# Patient Record
Sex: Male | Born: 1935
Health system: Southern US, Community
[De-identification: ages and names within clinical notes are randomized; demographics above are authoritative.]

## PROBLEM LIST (undated history)

## (undated) DIAGNOSIS — I714 Abdominal aortic aneurysm, without rupture, unspecified: Secondary | ICD-10-CM

## (undated) DIAGNOSIS — I779 Disorder of arteries and arterioles, unspecified: Secondary | ICD-10-CM

## (undated) DIAGNOSIS — E785 Hyperlipidemia, unspecified: Secondary | ICD-10-CM

## (undated) DIAGNOSIS — E039 Hypothyroidism, unspecified: Secondary | ICD-10-CM

## (undated) DIAGNOSIS — J449 Chronic obstructive pulmonary disease, unspecified: Secondary | ICD-10-CM

## (undated) DIAGNOSIS — I739 Peripheral vascular disease, unspecified: Secondary | ICD-10-CM

## (undated) DIAGNOSIS — M359 Systemic involvement of connective tissue, unspecified: Secondary | ICD-10-CM

## (undated) DIAGNOSIS — R0600 Dyspnea, unspecified: Secondary | ICD-10-CM

## (undated) DIAGNOSIS — Z9981 Dependence on supplemental oxygen: Secondary | ICD-10-CM

## (undated) DIAGNOSIS — J849 Interstitial pulmonary disease, unspecified: Secondary | ICD-10-CM

## (undated) DIAGNOSIS — IMO0002 Reserved for concepts with insufficient information to code with codable children: Secondary | ICD-10-CM

## (undated) DIAGNOSIS — R9430 Abnormal result of cardiovascular function study, unspecified: Secondary | ICD-10-CM

## (undated) DIAGNOSIS — I350 Nonrheumatic aortic (valve) stenosis: Secondary | ICD-10-CM

## (undated) HISTORY — PX: HERNIA REPAIR: SHX51

## (undated) HISTORY — DX: Peripheral vascular disease, unspecified: I73.9

## (undated) HISTORY — DX: Hyperlipidemia, unspecified: E78.5

## (undated) HISTORY — DX: Nonrheumatic aortic (valve) stenosis: I35.0

## (undated) HISTORY — DX: Reserved for concepts with insufficient information to code with codable children: IMO0002

## (undated) HISTORY — DX: Abdominal aortic aneurysm, without rupture, unspecified: I71.40

## (undated) HISTORY — DX: Hypothyroidism, unspecified: E03.9

## (undated) HISTORY — DX: Disorder of arteries and arterioles, unspecified: I77.9

## (undated) HISTORY — DX: Interstitial pulmonary disease, unspecified: J84.9

## (undated) HISTORY — DX: Abdominal aortic aneurysm, without rupture: I71.4

## (undated) HISTORY — DX: Systemic involvement of connective tissue, unspecified: M35.9

## (undated) HISTORY — DX: Abnormal result of cardiovascular function study, unspecified: R94.30

## (undated) HISTORY — DX: Chronic obstructive pulmonary disease, unspecified: J44.9

---

## 1997-04-28 HISTORY — PX: COLONOSCOPY: SHX174

## 1999-09-23 ENCOUNTER — Inpatient Hospital Stay (HOSPITAL_COMMUNITY): Admission: EM | Admit: 1999-09-23 | Discharge: 1999-09-25 | Payer: Self-pay | Admitting: Internal Medicine

## 1999-09-25 ENCOUNTER — Encounter: Payer: Self-pay | Admitting: Internal Medicine

## 2001-04-28 HISTORY — PX: ESOPHAGOGASTRODUODENOSCOPY: SHX1529

## 2001-07-21 ENCOUNTER — Encounter: Payer: Self-pay | Admitting: Gastroenterology

## 2001-07-21 ENCOUNTER — Ambulatory Visit (HOSPITAL_COMMUNITY): Admission: RE | Admit: 2001-07-21 | Discharge: 2001-07-21 | Payer: Self-pay | Admitting: Gastroenterology

## 2005-01-22 ENCOUNTER — Ambulatory Visit: Payer: Self-pay | Admitting: Cardiology

## 2005-03-03 ENCOUNTER — Ambulatory Visit: Payer: Self-pay | Admitting: Cardiology

## 2005-07-14 ENCOUNTER — Ambulatory Visit: Payer: Self-pay | Admitting: Cardiology

## 2005-11-06 ENCOUNTER — Ambulatory Visit: Payer: Self-pay | Admitting: Internal Medicine

## 2005-12-18 ENCOUNTER — Ambulatory Visit: Payer: Self-pay | Admitting: Internal Medicine

## 2006-07-30 ENCOUNTER — Ambulatory Visit: Payer: Self-pay | Admitting: Cardiology

## 2006-08-08 ENCOUNTER — Ambulatory Visit: Payer: Self-pay | Admitting: Cardiology

## 2006-09-14 ENCOUNTER — Ambulatory Visit: Payer: Self-pay | Admitting: Cardiology

## 2006-12-14 ENCOUNTER — Ambulatory Visit: Payer: Self-pay | Admitting: Cardiology

## 2007-07-27 ENCOUNTER — Ambulatory Visit: Payer: Self-pay | Admitting: Cardiology

## 2008-04-28 HISTORY — PX: COLONOSCOPY: SHX174

## 2008-11-06 ENCOUNTER — Ambulatory Visit: Payer: Self-pay | Admitting: Cardiology

## 2009-06-12 ENCOUNTER — Ambulatory Visit (HOSPITAL_COMMUNITY): Admission: RE | Admit: 2009-06-12 | Discharge: 2009-06-13 | Payer: Self-pay | Admitting: Ophthalmology

## 2009-06-13 ENCOUNTER — Encounter (INDEPENDENT_AMBULATORY_CARE_PROVIDER_SITE_OTHER): Payer: Self-pay | Admitting: Ophthalmology

## 2010-06-26 ENCOUNTER — Encounter: Payer: Self-pay | Admitting: Physician Assistant

## 2010-06-26 ENCOUNTER — Encounter: Payer: Self-pay | Admitting: Cardiology

## 2010-06-27 ENCOUNTER — Encounter: Payer: Self-pay | Admitting: Cardiology

## 2010-06-27 DIAGNOSIS — R079 Chest pain, unspecified: Secondary | ICD-10-CM

## 2010-06-27 DIAGNOSIS — R072 Precordial pain: Secondary | ICD-10-CM

## 2010-07-02 ENCOUNTER — Encounter: Payer: Self-pay | Admitting: Cardiology

## 2010-07-02 ENCOUNTER — Ambulatory Visit (INDEPENDENT_AMBULATORY_CARE_PROVIDER_SITE_OTHER): Payer: Medicare Other | Admitting: Cardiology

## 2010-07-02 DIAGNOSIS — I080 Rheumatic disorders of both mitral and aortic valves: Secondary | ICD-10-CM | POA: Insufficient documentation

## 2010-07-02 DIAGNOSIS — I2510 Atherosclerotic heart disease of native coronary artery without angina pectoris: Secondary | ICD-10-CM

## 2010-07-02 DIAGNOSIS — I350 Nonrheumatic aortic (valve) stenosis: Secondary | ICD-10-CM | POA: Insufficient documentation

## 2010-07-02 DIAGNOSIS — E039 Hypothyroidism, unspecified: Secondary | ICD-10-CM | POA: Insufficient documentation

## 2010-07-02 DIAGNOSIS — E785 Hyperlipidemia, unspecified: Secondary | ICD-10-CM | POA: Insufficient documentation

## 2010-07-02 DIAGNOSIS — I6529 Occlusion and stenosis of unspecified carotid artery: Secondary | ICD-10-CM | POA: Insufficient documentation

## 2010-07-02 DIAGNOSIS — I495 Sick sinus syndrome: Secondary | ICD-10-CM | POA: Insufficient documentation

## 2010-07-02 HISTORY — DX: Nonrheumatic aortic (valve) stenosis: I35.0

## 2010-07-04 NOTE — Consult Note (Signed)
Summary: CARDIOLOGY CONSULT/ [facility]  CARDIOLOGY CONSULT/ [facility]   Imported By: Zachary George 06/28/2010 09:35:43  _____________________________________________________________________  External Attachment:    Type:   Image     Comment:   External Document

## 2010-07-09 NOTE — Miscellaneous (Signed)
  Clinical Lists Changes  Observations: Added new observation of PAST MED HX:  dyslipidemia COPD... pulmonary fibrosis...,bronchiectasis.... followed [facility]  Dr.Morrison pulmonary nodule.... stable. by history BPH Esophageal stricture  hiatal hernia parotiditis Squamous cell carcinoma    2000 Hypothyroidism Herpes zoster Peripheral neuropathy Paresthesias   left arm... no etiology in the past Mitral regurgitation   mild.... echo... August, 2009 /  mild... echo... March, 2012 renal insufficiency...mild CAD                 PCI LAD 1993  /  catheterization 2001, 80% diagonal, 80% circumflex, long 99% subtotal RCA with distal collateralization from the circumflex... medical therapy recommended /  nuclear July, 2010.. no ischemia.. EF 65% EF      normal by history /  EF 65% nuclear  July, 2010  / EF 60%... echo... March, 2012 Aortic valve sclerosis   echo... March, 2012 Sinus bradycardia   chronic AAA   3.1 cm.  March, 2010 carotid artery disease   Doppler.  2009... less than 50% bilaterally.... plaque he is present (07/02/2010 8:45) Added new observation of PRIMARY MD: Sherryll Burger (07/02/2010 8:45)       Past History:  Past Medical History:  dyslipidemia COPD... pulmonary fibrosis...,bronchiectasis.... followed [facility]  Dr.Morrison pulmonary nodule.... stable. by history BPH Esophageal stricture  hiatal hernia parotiditis Squamous cell carcinoma    2000 Hypothyroidism Herpes zoster Peripheral neuropathy Paresthesias   left arm... no etiology in the past Mitral regurgitation   mild.... echo... August, 2009 /  mild... echo... March, 2012 renal insufficiency...mild CAD                 PCI LAD 1993  /  catheterization 2001, 80% diagonal, 80% circumflex, long 99% subtotal RCA with distal collateralization from the circumflex... medical therapy recommended /  nuclear July, 2010.. no ischemia.. EF 65% EF      normal by history /  EF 65% nuclear  July, 2010  / EF 60%... echo... March,  2012 Aortic valve sclerosis   echo... March, 2012 Sinus bradycardia   chronic AAA   3.1 cm.  March, 2010 carotid artery disease   Doppler.  2009... less than 50% bilaterally.... plaque he is present

## 2010-07-09 NOTE — Consult Note (Signed)
Summary: CARDIOLOGY CONSULT/ [facility]  CARD[facility]OLOGY CON[facility]ULT/ [facility]   Imported By: Zachary George 07/02/2010 10:16:23  _____________________________________________________________________  External Attachment:    Type:   Image     Comment:   External Document

## 2010-07-09 NOTE — Medication Information (Signed)
Summary: [facility] D/C MEDICATION SHEET ORDER  [facility] D/[facility] MEDICATION SHEET ORDER   Imported By: Zachary George 07/02/2010 10:19:20  _____________________________________________________________________  External Attachment:    Type:   Image     Comment:   External Document

## 2010-07-09 NOTE — Assessment & Plan Note (Signed)
Summary: [facility]-POST [facility] PER [facility] WITH [facility] FIRST AVIALABLE -SRS   Visit Type:  post hospital visit Primary Provider:  Kirstie Peri, MD  CC:  CAD.  History of Present Illness: The patient is seen for followup of coronary artery disease..  I had seen him in the office last in 2009.  I have reviewed the old records and updated the current electronic medical record.  The patient was hospitalized in February, 2012.  I have reviewed the hospital records and the echocardiogram.  The patient had chest pain.  He is active and works out at [employer].  Looking back I believe that he had musculoskeletal pain and brought him into the hospital.  At the time of admission his pain was concerning.  Cardiac enzymes were negative.  Two-dimensional echo revealed ejection fraction of 60%.  He has aortic valve sclerosis but no stenosis.  A nuclear stress study had been done in July, 2010.  There was no ischemia.  It was felt that he did not need a followup stress test.  Since being out of the hospital he has felt very well.  He looks quite good today.  Current Medications (verified): 1)  Mycophenolate Mofetil 500 Mg Tabs (Mycophenolate Mofetil) .Marland Kitchen.. 1 By Mouth Two Times A Day 2)  Prednisone 10 Mg Tabs (Prednisone) .... 1/2 By Mouth Daily 3)  Nexium 40 Mg Cpdr (Esomeprazole Magnesium) .Marland Kitchen.. 1 By Mouth Daily 4)  Flomax 0.4 Mg Caps (Tamsulosin Hcl) .Marland Kitchen.. 1 By Mouth Daily 5)  Levothroid 25 Mcg Tabs (Levothyroxine Sodium) .Marland Kitchen.. 1 By Mouth Daily 6)  Lipitor 80 Mg Tabs (Atorvastatin Calcium) .Marland Kitchen.. 1 By Mouth Daily 7)  Nasonex 50 Mcg/act Susp (Mometasone Furoate) .... Uad 8)  Niacin Cr 500 Mg Cr-Caps (Niacin) .Marland Kitchen.. 1 By Mouth Daily 9)  Spiriva Handihaler 18 Mcg Caps (Tiotropium Bromide Monohydrate) .... Uad 10)  Nac 600mg  .... 1 By Mouth Three Times A Day 11)  Fish Oil   Oil (Fish Oil) .Marland Kitchen.. 1 By Mouth Daily 12)  Calcium 600 Mg Tabs (Calcium) .Marland Kitchen.. 1 By Mouth Three Times A Day 13)  Multivitamins   Tabs (Multiple Vitamin) .Marland Kitchen.. 1 By  Mouth Daily 14)  Tessalon Perles 100 Mg Caps (Benzonatate) .... Uad 15)  Saw Palmetto 450 Mg Caps (Saw Palmetto (Serenoa Repens)) .... 2 By Mouth Daily 16)  Vitamin B-12 500 Mcg Tabs (Cyanocobalamin) .Marland Kitchen.. 1 By Mouth Daily 17)  Aspirin 81 Mg  Tabs (Aspirin) .Marland Kitchen.. 1 By Mouth Daily  Allergies (verified): No Known Drug Allergies  Past History:  Past Medical History:  dyslipidemia COPD... pulmonary fibrosis...,bronchiectasis.... followed [facility]  Dr.Morrison pulmonary nodule.... stable. by history BPH Esophageal stricture  hiatal hernia parotiditis Squamous cell carcinoma    2000 Hypothyroidism Herpes zoster Peripheral neuropathy Paresthesias   left arm... no etiology in the past Mitral regurgitation   mild.... echo... August, 2009 /  mild... echo... March, 2012 renal insufficiency...mild CAD                 PCI LAD 1993  /  catheterization 2001, 80% diagonal, 80% circumflex, long 99% subtotal RCA with distal collateralization from the circumflex... medical therapy recommended /  nuclear July, 2010.. no ischemia.. EF 65% EF      normal by history /  EF 65% nuclear  July, 2010  / EF 60%... echo... March, 2012 Aortic valve sclerosis   echo... March, 2012 Sinus bradycardia   chronic.Marland Kitchen AAA   3.1 cm.  March, 2010 carotid artery disease   Doppler.  2009... less than 50% bilaterally.... plaque he is present  Review of Systems       Patient denies fever, chills, headache, sweats, rash, change in vision, change in hearing, chest pain, cough, nausea vomiting, urinary symptoms.  All other systems are reviewed and are negative.  Vital Signs:  Patient profile:   77 year old male Height:      71 inches Weight:      185 pounds BMI:     25.90 Pulse rate:   62 / minute Resp:     18 per minute BP sitting:   119 / 67  (left arm)  Vitals Entered By: Marrion Coy, CNA (July 02, 2010 10:59 AM)  Physical Exam  General:  he looks great today. Head:  head is atraumatic. Eyes:  no  xanthelasma. Neck:  no jugular venous distention. Chest Wall:  no chest wall tenderness. Lungs:  lungs are clear.  Respiratory effort is nonlabored. Heart:  cardiac exam reveals S1-S2.  There is a 2/6 systolic murmur. Abdomen:  abdomen soft. Msk:  no musculoskeletal deformities. Extremities:  no peripheral edema. Skin:  no skin rashes. Psych:  patient is oriented to person time and place.  Affect is normal.   Impression & Recommendations:  Problem # 1:  * COPD /PULMONARY FIBROSIS /BRONCHIECTASIS The patient has significant lung problems that are followed at [facility].  No further workup.  Problem # 2:  DYSLIPIDEMIA (ICD-272.4) Lipids are being treated.  No change in therapy.  Problem # 3:  CAROTID ARTERY DISEASE (ICD-433.10)  We know.from carotid Doppler 2009 the patient does have flat.  He did not have significant stenosis. We will review the records further to see if he has had a carotid Doppler since then. if not this will be scheduled.  Orders: Carotid Duplex (Carotid Duplex)  Problem # 4:  AAA (ICD-441.4) The patient has small bowel aortic aneurysm that is followed. It was stable in 2010.  We will check the records to see if he has had followup since then.  Problem # 5:  SINUS BRADYCARDIA (ICD-427.81) The rhythm is stable.  No further workup.  Problem # 6:  AORTIC VALVE SCLEROSIS (ICD-424.1) Patient has aortic valve sclerosis by echo.  No further workup is needed.  Problem # 7:  CAD (ICD-414.00) Coronary disease is stable.  No further workup is needed at this time.  Problem # 8:  HYPOTHYROIDISM (ICD-244.9) Thyroid is treated.  No further workup.  I see him back in one year for followup  Other Orders: Abdominal Aorta Duplex (Abd Aorta Dup)  Patient Instructions: 1)  Your physician wants you to follow-up in: 1 year. You will receive a reminder letter in the mail one-two months in advance. If you don't receive a letter, please call our office to schedule the follow-up  appointment. 2)  Your physician has requested that you have an abdominal aorta duplex. During this test, an ultrasound is used to evaluate the aorta. Allow 30 minutes for this exam. Do not eat after midnight the day before and avoid carbonated beverages. There are no restrictions or special instructions. 3)  Your physician has requested that you have a carotid duplex. This test is an ultrasound of the carotid arteries in your neck. It looks at blood flow through these arteries that supply the brain with blood. Allow one hour for this exam. There are no restrictions or special instructions. 4)  Your physician recommends that you continue on your current medications as directed. Please refer to the  Current Medication list given to you today.

## 2010-07-09 NOTE — Miscellaneous (Signed)
  Clinical Lists Changes  Problems: Removed problem of ORTHOSTATIC HYPOTENSION (ICD-458.0) Removed problem of ABNORMAL CV (STRESS) TEST (ICD-794.39) Removed problem of HYPERLIPIDEMIA TYPE I / IV (ICD-272.3) Added new problem of HYPOTHYROIDISM (ICD-244.9) Added new problem of * HERPES ZOSTER Added new problem of * PARESTHESIAS LEFT ARM Added new problem of MITRAL REGURGITATION (ICD-396.3) Added new problem of CAD (ICD-414.00) Added new problem of AORTIC VALVE SCLEROSIS (ICD-424.1) Added new problem of SINUS BRADYCARDIA (ICD-427.81) Added new problem of AAA (ICD-441.4) Added new problem of CAROTID ARTERY DISEASE (ICD-433.10) Added new problem of DYSLIPIDEMIA (ICD-272.4) Added new problem of * COPD /PULMONARY FIBROSIS /BRONCHIECTASIS Added new problem of * PULMONARY NODULE Added new problem of * ESOPHAGEAL STRICTURE Added new problem of * PAROTIDITIS Observations: Added new observation of PAST MED HX:  dyslipidemia COPD... pulmonary fibrosis...,bronchiectasis.... followed [facility]  Dr.Morrison pulmonary nodule.... stable. by history BPH Esophageal stricture  hiatal hernia parotiditis Squamous cell carcinoma    2000 Hypothyroidism Herpes zoster Peripheral neuropathy Paresthesias   left arm... no etiology in the past Mitral regurgitation   mild.... echo... August, 2009 /  mild... echo... March, 2012 renal insufficiency...mild CAD                 PCI LAD 1993  /  catheterization 2001, 80% diagonal, 80% circumflex, long 99% subtotal RCA with distal collateralization from the circumflex... medical therapy recommended /  nuclear July, 2010.. no ischemia.. EF 65% EF      normal by history /  EF 65% nuclear  July, 2010  / EF 60%... echo... March, 2012 Aortic valve sclerosis   echo... March, 2012 Sinus bradycardia   chronic AAA   3.1 cm.  March, 2010 carotid artery disease   Doppler.  2009... less than 50% bilaterally.... plaque he is present (07/02/2010 8:27) Added new observation of  PRIMARY MD: Sherryll Burger (07/02/2010 8:27)       Past History:  Past Medical History:  dyslipidemia COPD... pulmonary fibrosis...,bronchiectasis.... followed [facility]  Dr.Morrison pulmonary nodule.... stable. by history BPH Esophageal stricture  hiatal hernia parotiditis Squamous cell carcinoma    2000 Hypothyroidism Herpes zoster Peripheral neuropathy Paresthesias   left arm... no etiology in the past Mitral regurgitation   mild.... echo... August, 2009 /  mild... echo... March, 2012 renal insufficiency...mild CAD                 PCI LAD 1993  /  catheterization 2001, 80% diagonal, 80% circumflex, long 99% subtotal RCA with distal collateralization from the circumflex... medical therapy recommended /  nuclear July, 2010.. no ischemia.. EF 65% EF      normal by history /  EF 65% nuclear  July, 2010  / EF 60%... echo... March, 2012 Aortic valve sclerosis   echo... March, 2012 Sinus bradycardia   chronic AAA   3.1 cm.  March, 2010 carotid artery disease   Doppler.  2009... less than 50% bilaterally.... plaque he is present

## 2010-07-17 LAB — CBC
HCT: 41.2 % (ref 39.0–52.0)
Hemoglobin: 13.8 g/dL (ref 13.0–17.0)
MCHC: 33.5 g/dL (ref 30.0–36.0)
MCV: 93 fL (ref 78.0–100.0)
Platelets: 227 10*3/uL (ref 150–400)
RBC: 4.43 MIL/uL (ref 4.22–5.81)
RDW: 14.1 % (ref 11.5–15.5)
WBC: 9.8 10*3/uL (ref 4.0–10.5)

## 2010-09-10 ENCOUNTER — Encounter: Payer: Self-pay | Admitting: *Deleted

## 2010-09-10 NOTE — Assessment & Plan Note (Signed)
[facility]                          [facility]   NAME:Rodney Bowman, Rodney Bowman                        MRN:          [id number]  DATE:09/14/2006                            DOB:          02-15-1922    PRIMARY CARDIOLOGIST:  Dr. Willa Rough.   REASON FOR VISIT:  Post hospital followup.   Rodney Bowman is a delightful 77 year old male, well-known to Dr. Myrtis Ser, with  remote history of percutaneous coronary intervention in 1993, and normal  left ventricular function, recently referred to [place] for evaluation of  syncopal episode.  The patient underwent extensive workup, including  exclusion of pulmonary embolus with a low probability  ventilation/perfusion scan, and was ultimately discharged with a  diagnosis of orthostatic hypotension.  In fact, he had significant drop  in blood pressure with readings of 116 systolic supine to 69 systolic  upon standing.   A 2D echo showed continued preserved left ventricular function with mild  mitral regurgitation.   The patient was taken off Flomax and low dose Atenolol, and was  instructed to liberalize his fluid intake.   The patient now presents in followup.  He has not had any recurrent  syncopal episodes.  His postural dizziness has also improved  significantly.  He did undergo a CardioNet monitor for 3 weeks and had  no associated symptoms during this time frame, including any tachy  palpitations.  Review of the results today suggests a transient run of  PSVT for 18 beats, with no other dysrhythmias and no documented pauses.   CURRENT MEDICATIONS:  1. Flomax 0.4 mg every other day.  2. Niaspan 500 b.i.d.  3. Lipitor 80 daily.  4. Nexium.  5. Levothyroxine 0.025 mg daily.  6. Aspirin 81 daily.   PHYSICAL EXAMINATION:  Blood pressure 112/68, pulse 60 and regular,  weight 181.4.  GENERAL:  An 77 year old male sitting upright in no distress.  HEENT:  Normocephalic/atraumatic.  NECK:  Palpable bilateral  carotid pulses without bruits, no JVD.  LUNGS:  Clear to auscultation all fields.  HEART:  Regular rate and rhythm (S1-S2); no murmurs, rubs, or gallops.  ABDOMEN:  Benign.  EXTREMITIES:  No pedal edema.  NEURO:  No focal deficit.   IMPRESSION:  1. Status post syncope.      a.     Secondary to orthostatic hypotension.      b.     Stable off low-dose Atenolol/down titration of Flomax.  2. Coronary artery disease - stable.      a.     Status post percutaneous coronary intervention left anterior       descending 1993.      b.     Low-risk exercise stress Cardiolite, September 2006.  3. Preserved left ventricular function.  4. Small infrarenal abdominal aortic aneurysm.  5. Nonobstructive cerebrovascular disease.  6. Hyperlipidemia.  7. Mild renal insufficiency.   PLAN:  1. Continue current medication regimen.  2. Schedule return clinic followup with myself and Dr. Willa Rough in      6 months.      Rodney Serpe, PA-C  Electronically  Signed      Rodney Abed, MD, [facility]  Electronically Signed   GS/MedQ  DD: 09/14/2006  DT: 09/14/2006  Job #: [id number]   cc:   Rodney Bowman

## 2010-09-10 NOTE — Assessment & Plan Note (Signed)
[facility]                          [facility]   NAME:Armijo, Rodney Bowman                        MRN:          [id number]  DATE:12/14/2006                            DOB:          January 14, 1922    Rodney Bowman is doing well.  He has known coronary disease.  He has had  significant orthostatic hypotension in April of 2008, and he is now much  better.  His CardioNet monitor after hospitalization showed no marked  abnormalities.  He may have some brief supraventricular tachycardia.  He  has good LV function and mild mitral regurgitation.  He is going about  full activities.  He is here today and he is feeling well.   PAST MEDICAL HISTORY:   ALLERGIES:  No known drug allergies.   MEDICATIONS:  1. Lipitor 80.  2. Niaspan 500 b.i.d.  3. Flomax 0.4 every other day.  4. Nexium 20.  5. Thyroid 25 mcg.  6. Aspirin 81.  7. Vitamins.   OTHER MEDICAL PROBLEMS:  See the list below.   REVIEW OF SYSTEMS:  His review of systems today is negative.   PHYSICAL EXAMINATION:  VITAL SIGNS:  Blood pressure is 110/60 with a  pulse of 76.  GENERAL:  The patient is oriented to person, time and place.  Affect is  normal.  He has a hearing aid in place.  HEENT:  No xanthelasma.  He has normal extraocular motion.  There are no  carotid bruits.  There is no jugular venous distention.  LUNGS:  Clear.  Respiratory effort is not labored.  CARDIAC:  An S1 with an S2.  There are no clicks or significant murmurs.  ABDOMEN:  Soft.  He has normal bowel sounds.  There is no peripheral  edema.   LABORATORY DATA:  No labs are done today.   PROBLEMS:  1. History in the past of some paresthesias of the left arm.  No      etiology was found.  2. Question of an abdominal aortic aneurysm in the past.  Follow up of      tissue showed that in fact there was no major abnormality.  3. Hyperlipidemia on medications.  4. Significant lung disease with history of some  bronchiectasis and      COPD that is stable.  5. History of a pulmonary nodule that was stable.  6. History of hiatal hernia.  7. Coronary artery disease.  This is stable.  8. Question of a carotid bruit in the past.  He has had only minor      carotid disease documented by Doppler in 2006.  9. Significant symptoms from orthostatic hypotension in April of 2008.      His medications were adjusted.  He has stabilized and done very      nicely.  His Flomax dose was adjusted, and he was taken off      atenolol.  10.Coronary disease.  His last Cardiolite was normal in 2006.  11.Good left ventricular function.  12.Small infrarenal abdominal aortic aneurysm, and he will need follow  up in the future.  13.Mild renal insufficiency.   I will see him back in 6 months.  At that time, we will look further  into any other Doppler studies that are needed.     [name], MD, [facility]  Electronically Signed    JDK/MedQ  DD: 12/14/2006  DT: 12/15/2006  Job #: [id number]   cc:   Dennie Maizes, M.D.

## 2010-09-10 NOTE — Assessment & Plan Note (Signed)
[facility]                          [facility]   NAME:Monnig, VED MARTOS                        MRN:          [id number]  DATE:07/27/2007                            DOB:          [birth year]    Mr. Mccluney is doing well.  He is seen for cardiology followup.  He has  known coronary disease.  His Cardiolite was normal in 2006.  He had an  intervention in 1993.  He has a good LV function.  He has some  exertional shortness of breath at this time but no chest pain.   PAST MEDICAL HISTORY:   ALLERGIES:  No known drug allergies.   MEDICATIONS:  1. Lipitor 80.  2. Niaspan 500 b.i.d.  3. Flomax 0.4 every other day.  4. Thyroid.  5. Aspirin 81.  6. Calcium.  7. Multivitamin.  8. Medicines for diarrhea.  9. Nexium.   OTHER MEDICAL PROBLEMS:  See the list below.   REVIEW OF SYSTEMS:  He is actually doing well other than the HPI.   PHYSICAL EXAM:  Blood pressure is 107/64 with a pulse of 67.  Weight is  177 pounds.  O2 sat is 95% on room air.  The patient is oriented to person, time and place.  Affect is normal.  HEENT:  Reveals no xanthelasma.  He has normal extraocular motion.  There are no carotid bruits heard today.  There is no jugular venous  distention.  Lungs are clear.  Respiratory effort is not labored.  Cardiac exam reveals S1-S2.  There are no clicks or significant murmurs.  The abdomen is soft.  He has no peripheral edema.   No lab is done today.   PROBLEMS:  1. History of some paresthesias in his left arm in the past with no      etiology.  2. Question in the past of abdominal aortic aneurysm with followup      study showed that there was no major abnormality.  3. Hyperlipidemia on meds.  4. Significant lung disease with history of some bronchiectasis and      chronic obstructive pulmonary disease.  5. History of a pulmonary nodule that was stable.  6. History of hiatal hernia.  7. Coronary disease as outlined.  He  had PCI to the LAD in 1993 and a      low-risk Cardiolite in 2006.  He does not need any studies at this      time.  8. Question of carotid bruit in the past with mild disease in 2006.      He needs a followup carotid Doppler at this time.  9. Significant orthostatic hypotension in April 2008 and this improved      when his medicines were adjusted, including adjusting his Flomax.  10.Normal left ventricular function.  11.Mild renal insufficiency.  At this time he needs carotid Dopplers.      We will consider the followup of his abdominal status in another      year.     [name], MD, [facility]  Electronically Signed    JDK/MedQ  DD: 07/27/2007  DT: 07/27/2007  Job #: [id number]   cc:   Kirstie Peri, MD

## 2010-09-13 NOTE — Assessment & Plan Note (Signed)
[facility]                               PULMONARY OFFICE NOTE   NAME:Bowman, Rodney MAZON                        MRN:          [id number]  DATE:12/18/2005                            DOB:          Aug 27, 1921    HISTORY:  This 77 year old white male returns for a followup evaluation of  bronchiectasis, with PFTs to see whether or not he would benefit from  bronchodilators.  He is completely back to baseline following a short course  of prednisone, and treatment directed at reflux in the form of Prilosec  dosed 20 mg before meals twice daily.   PHYSICAL EXAMINATION:  GENERAL:  He is a pleasant, ambulatory white male in  no acute distress.  VITAL SIGNS:  Afebrile, stable vital signs.  HEENT:  Unremarkable, oropharynx is clear.  LUNGS:  Lung fields reveal few inspiratory rhonchi bilaterally.  CARDIAC:  Appears regular rhythm without murmur, gallop or rub.  ABDOMEN:  Soft, benign.  EXTREMITIES:  Warm without calf tenderness, cyanosis, clubbing or edema.   Chart review does indicate the patient has a hiatal hernia with GERD  documented July 21, 2001 by Dr. Melvia Heaps, with stricture noted as  well.   Chest x-ray shows bronchiectatic changes diffusely, but is not changed  compared to previous studies.  PFTs show no evidence of significant airflow  obstruction.   IMPRESSION:  Bronchiectasis in the setting of chronic dysphagia from  esophageal stricture, related to gastroesophageal reflux disease.  In this  setting, I think it is critical that he continue to take proton pump  inhibitor in some form.  He apparently was doing better when he was taking  Nexium rather than Prilosec, but his insurance company would not pay for it.  Rather than try to fight this battle for him from a pulmonary perspective,  if there is any issue about the need for proton pump inhibitors to be  reimbursed, I would rather that he see Dr. Arlyce Dice for this purpose.  To  bring him  in the Pulmonary Clinic, we can see him back here p.r.n. for  increased cough or dyspnea should they occur while being maintained on  adequate doses of proton pump inhibitors, but regular pulmonary followup at  this point should not be needed.                                   Charlaine Dalton. Sherene Sires, MD, [facility]   MBW/MedQ  DD:  12/18/2005  DT:  12/19/2005  Job #:  [id number]   cc:   Weyman Pedro, MD

## 2010-09-13 NOTE — Discharge Summary (Signed)
[place]  Patient:    Rodney Bowman, Rodney Bowman                        MRN: [id number] Adm. Date:  [id number] Disc. Date: 09/25/99 Attending:  Lewayne Bunting Dictator:   Leonides Cave, P.A. CC:         Weyman Pedro, M.D., [place], [place]             [name], M.D., [facility], [address]. Sherene Sires, M.D. [facility]                           Discharge Summary  DATE OF BIRTH:  07-26-1921  DISCHARGE DIAGNOSES: 1. Coronary artery disease with planned medical treatment. 2. Hypercholesterolemia.  BRIEF HISTORY:  The patient is a 77 year old white male who was transferred from [facility] on Sep 23, 1999 with history of coronary artery disease, status post angioplasty x 2 in 1993 at [facility].  The patient has done excellent from a cardiovascular standpoint since.  He states he has a yearly GXT Cardiolite and has had no problem.  As a matter of fact, the patient had a negative GXT Cardiolite on Sep 10, 1999 with an ejection fraction of 45% and no ischemia at [facility] per Dr. Myrtis Ser.  On the morning of admission, at 3 oclock in the morning, the patient was sitting in a chair when he developed a nagging chest pressure on the left side.  The patient denies any radiation of pain, shortness of breath, diaphoresis, nausea, or vomiting.  The patient continued through 6 a.m. when his wife gave him Maalox, aspirin, and two expired nitroglycerin tablets without any relief. The patient was able to go back to sleep on his own and around 8 oclock he awoke and was pain free; however, around 15 minutes after being awake, the same pain returned.  The patient went to the pharmacy and got a prescription of nitroglycerin filled.  He gave the patient two tablets, which did not change his pain.  He therefore drove himself to [facility] Emergency [facility].  At [facility], the patients initial enzymes were negative and EKGs were  without acute changes.  He was transferred for further evaluation.  The patient was given Lovenox and set up for cardiac catheterization.  HOSPITAL COURSE:   The patient was taken to the catheterization lab by Dr. Glennon Hamilton on Sep 24, 1999.  Please see dictated report for full details, as I cannot clearly make out the progress notes at this time.  There was significant disease seen, however.  Dr. Glennon Hamilton discussed the case with Dr. Riley Kill and felt that medical treatment was the best option.  If the patient had recurrent symptoms, Dr. Corinda Gubler felt that PTCA of the circumflex could be performed at a later time.  The patient was subsequently discharged home the following day, Sep 25, 1999, in stable condition.  The patient did have an abdominal ultrasound on his day of discharge, which revealed normal renal ultrasound and the aorta was difficult to assess, per the radiologist.  DISCHARGE MEDICATIONS:  He will continue on his Lipitor, Questran, Atenolol 25 mg q.d., Flomax, and Advair as taken before.  He was given new prescriptions for Imdur 30 mg q.d. and Altace 2.5 mg q.d. and he was told to  continue an enteric-coated aspirin 325 mg q.d. daily.  Dr. Glennon Hamilton started the patient on Altace and was hoping to increase that on an outpatient basis. Dr. Corinda Gubler had also increased his Atenolol from 25 mg a day to 50; however, the patients heart rate was running in the 50s, and so this is something that we can readdress on outpatient follow-up.  DISCHARGE INSTRUCTIONS:  The patient was instructed to undergo no heavy lifting or driving for two days.  He was told to adhere to a low fat/low cholesterol diet.  He was told that if bleeding or swelling occurred at the catheterization site he should call the [place] office in [place] immediately.  FOLLOW-UP:  The patient will follow up with Dr. Weyman Pedro in three to four weeks.  He will call for that appointment.  He will also see Dr. Myrtis Ser  as already scheduled in [place] on October 03, 1999.  The patient was given the instructions to have a basic metabolic panel drawn on the morning of or the day before that appointment and consideration for a followup GXT Cardiolite can be made as an outpatient to once again assess for ischemia; however, this was not scheduled because he had just recently had one. DD:  09/25/99 TD:  09/25/99 Job: [id number]

## 2010-09-13 NOTE — Cardiovascular Report (Signed)
[place]  Patient:    Rodney Bowman, Rodney Bowman                        MRN: [id number] Proc. Date: 09/24/99 Adm. Date:  [id number] Attending:  Lewayne Bunting CC:         Weyman Pedro, M.D.             [name], M.D. [facility], [facility]             Cardiac Catheterization Laboratory             Rudene Christians. Ladona Ridgel, M.D. [facility]                        Cardiac Catheterization  INDICATIONS:  The patient is 77 years old.  In 1993 he had attempted angioplasty of a total RCA which was unsuccessful and he did have successful angioplasty of the LAD and RCA.  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 with chest discomfort.  PROCEDURES:  Catheterization at this time included left heart catheterization with coronary angiography, left ventricular angiography and abdominal aortography--Judkins technique.  RESULTS:  PRESSURES:  Left ventricular systolic is 151, diastolic 16.  Aortic systolic 151, diastolic 84.  ANGIOGRAPHY:  There was heavy calcium throughout the coronary bed. 1. Left main:  The left main had a 30% distal stenosis. 2. Left anterior descending:  The LAD had a diagonal with an 80% lesion and    the LAD revealed segmental areas of disease but no high-grade stenosis.    The LAD had a 30% ostial lesion. 3. Circumflex:  The circumflex had a focal area of a 70-80%. 4. Right coronary artery:  The right coronary artery revealed a long    area of 90% stenosis and 99% subtotal lesion in the distal RCA.    The distal RCA fills through collaterals from the circumflex. 5. Left ventricle:  The left ventricle is normal. 6. Abdominal aortogram revealed normal renal arteries.  There was a    small to moderate sized abdominal aortic aneurysm.  SUMMARY:  Three-vessel coronary disease as noted above with heavy calcification.  The LV was normal.  There was an  abdominal aneurysm.  Cine reviewed with Dr. Riley Kill, and it was felt that the circumflex lesion was probably not causing his present symptoms.  His RCA was not approachable by angioplasty and did have moderately good collaterals.  It was felt that his medicines should be further maximized and then followup Cardiolite, although his family states that he had a recent stress test and this will be reviewed.  In summary, if there is recurrent symptoms consideration should be given to percutaneous treatment of the circumflex and diagonal.  I do not think surgery is indicated at this time. DD:  09/24/99 TD:  09/25/99 Job: [id number]

## 2010-09-13 NOTE — Assessment & Plan Note (Signed)
[facility]                               PULMONARY OFFICE NOTE   NAME:Rodney Bowman, Rodney Bowman                        MRN:          [id number]  DATE:11/06/2005                            DOB:          05-24-1921    REFERRING PHYSICIAN:  Weyman Pedro, M.D.   HISTORY:  77 year old white male with history of asthmatic bronchitis status  post remote smoking cessation associated with bronchiectasis and  interstitial lung disease.  Last CT exam evaluated in March of 2003 showing  no significant progression from 2001 to 2003.  His main complaint is one of  intermittent cough with minimum sputum production brought on by talking,  singing, but not typically exacerbated early in the morning typical  bronchitic.  He complains of dyspnea with exertion but not with regular  activities of daily living.  He has no history of any pleuritic pain,  hemoptysis, fever, chills, weight loss.  He says the only thing that makes  his cough better is sometimes cough syrups.  He does describe increasing  cough over the last two weeks.  Again, no significant excess sputum  production, fever, chills.  He denies any pattern in terms of the onset or  alleviating factors that he can identify in terms of either weather or  environmental changes.   PAST MEDICAL HISTORY:  Significant for the absence of upper respiratory  complaints.  He has undergone two hernia repairs and angioplasty in 1993.  Allergies none known.  Medications reviewed in detail and did not include  any pulmonary medications.  See column dated November 06, 2005 for details.  Significant for hiatal hernia complicated by strictures.  Status post  dilatation in 2003.  The patient is supposed to be maintained on Prilosec  but says that lots of times he forgets it.   SOCIAL HISTORY:  He quit smoking in 1965.  He is retired with no unusual  travel, pet or hobby exposure.   FAMILY HISTORY:  Positive for pancreatic cancer in his  mother.  Throat  cancer and leukemia in his brother who also had heart disease.   REVIEW OF SYSTEMS:  Taken from the worksheet and significant for the  problems outlined above.   PHYSICAL EXAMINATION:  This is a robust, well preserved, ambulatory white  man in no acute distress.  Patient had stable vital signs.  HEENT:  Remarkable for a full set of dentures.  Oropharynx was clear with no  excessive postnasal drainage or cobblestoning.  He was somewhat hoarse.  Ear  canals were clear bilaterally.  NECK:  Supple without cervical adenopathy or tenderness.  Trachea is  midline.  No thyromegaly.  LUNGS:  Lung fields reveal a few inspiratory more than expiratory rhonchi,  right greater than left base.  Overall air movement is adequate.  HEART:  Regular rhythm without murmur, gallop or rub.  ABDOMEN:  Soft, benign.  EXTREMITIES:  Without calf tenderness, cyanosis, or clubbing.   No recent x-rays are available.   IMPRESSION:  1.  Acute ongoing chronic coughing that has an atypical pattern for  bronchiectasis, in that it occurs more with talking and singing than it      does with early morning typical of mucociliary  dysfunction syndrome.      It is also not associated with any sinusitis or purulent sputum      production.  I note that the patient does have a history of significant      reflux and is not consistently taking his Prilosec.  The first thing I      would do is to ask him to take the Prilosec perfectly regularly before      meals and add Mucinex DM two b.i.d. to control  excess coughing (because      excess coughing is going to cause reflux inducing more coughing).  Take      a 6-day  course of prednisone empirically for any inflammatory problem.  2.  I would like for the patient to return in six weeks for a full set of      PFT's and chest x-ray for restaging purposes, but I don't recommend      any additional measures at this point.                                    Charlaine Dalton. Sherene Sires, MD, [facility]   MBW/MedQ  DD:  11/07/2005  DT:  11/07/2005  Job #:  [id number]

## 2010-09-26 ENCOUNTER — Encounter: Payer: Self-pay | Admitting: *Deleted

## 2010-09-26 LAB — PULMONARY FUNCTION TEST

## 2010-10-03 ENCOUNTER — Telehealth: Payer: Self-pay | Admitting: *Deleted

## 2010-10-03 NOTE — Telephone Encounter (Signed)
Pt left message on 10/02/10 at 1650 requesting a return call regarding the certified letter he received.  Contacted pt today. Pt had carotid and abd u/s ordered following his 3/6 office visit. Pt did not want to schedule his tests at that time and said he would call to schedule. Pt never contacted the office so a letter was mailed asking pt to call. He did not call so a certified letter was mailed. Pt states he had abd u/s done at [facility]. He does not think he had a carotid done. Pt asked that we get report from [facility] and call him if he needs to do Carotid.   Reports requested from [place] at [facility].

## 2010-10-03 NOTE — Telephone Encounter (Signed)
Pt notified that we received abd u/s from [facility]. It will be sent to Dr. Myrtis Ser for review. Pt notified carotid doppler has not been done. Pt states he would like for [facility] to order this for him. He will make sure a copy gets to our office.

## 2010-10-04 ENCOUNTER — Encounter: Payer: Self-pay | Admitting: Cardiology

## 2010-10-04 DIAGNOSIS — I714 Abdominal aortic aneurysm, without rupture: Secondary | ICD-10-CM | POA: Insufficient documentation

## 2010-10-10 ENCOUNTER — Encounter: Payer: Self-pay | Admitting: Cardiology

## 2010-10-16 ENCOUNTER — Telehealth: Payer: Self-pay | Admitting: Cardiology

## 2010-10-16 NOTE — Telephone Encounter (Signed)
PT STATES HE GOT HIS TEST DONE BY DR SHAW EVERYTHING WAS NORMAL. PT WANTED TO IN FORM DR KATZ.

## 2010-10-16 NOTE — Telephone Encounter (Signed)
Spoke w/pt's wife she states pt had a carotid u/s done yest at Dr Alver Fisher office and she wanted to make sure they sent the report to Dr Myrtis Ser, advised they normally go to [facility] office will forward mess to them to be on look out for report

## 2010-10-16 NOTE — Telephone Encounter (Signed)
Reports available in Epic. Sent to Dr. Myrtis Ser for review.

## 2010-10-24 ENCOUNTER — Encounter: Payer: Self-pay | Admitting: Cardiology

## 2010-10-24 DIAGNOSIS — I714 Abdominal aortic aneurysm, without rupture: Secondary | ICD-10-CM | POA: Insufficient documentation

## 2010-10-24 DIAGNOSIS — R9430 Abnormal result of cardiovascular function study, unspecified: Secondary | ICD-10-CM | POA: Insufficient documentation

## 2010-10-24 DIAGNOSIS — I739 Peripheral vascular disease, unspecified: Secondary | ICD-10-CM

## 2010-10-24 NOTE — Telephone Encounter (Signed)
Pt's wife notified and verbalized understanding.

## 2010-10-24 NOTE — Telephone Encounter (Signed)
Echo and carotid Dopplers reviewed in EMR.  Stable

## 2010-11-12 ENCOUNTER — Encounter: Payer: Self-pay | Admitting: Cardiology

## 2011-06-04 DIAGNOSIS — R0902 Hypoxemia: Secondary | ICD-10-CM | POA: Insufficient documentation

## 2011-06-04 DIAGNOSIS — J849 Interstitial pulmonary disease, unspecified: Secondary | ICD-10-CM | POA: Insufficient documentation

## 2011-06-04 DIAGNOSIS — J439 Emphysema, unspecified: Secondary | ICD-10-CM | POA: Insufficient documentation

## 2011-08-11 ENCOUNTER — Encounter: Payer: Self-pay | Admitting: Internal Medicine

## 2013-11-25 ENCOUNTER — Encounter: Payer: Self-pay | Admitting: *Deleted

## 2013-11-25 ENCOUNTER — Encounter: Payer: Self-pay | Admitting: Cardiovascular Disease

## 2013-11-25 ENCOUNTER — Ambulatory Visit (INDEPENDENT_AMBULATORY_CARE_PROVIDER_SITE_OTHER): Payer: Medicare Other | Admitting: Cardiovascular Disease

## 2013-11-25 VITALS — BP 118/69 | HR 62 | Wt 177.0 lb

## 2013-11-25 DIAGNOSIS — I209 Angina pectoris, unspecified: Secondary | ICD-10-CM

## 2013-11-25 DIAGNOSIS — I959 Hypotension, unspecified: Secondary | ICD-10-CM

## 2013-11-25 DIAGNOSIS — I714 Abdominal aortic aneurysm, without rupture, unspecified: Secondary | ICD-10-CM

## 2013-11-25 DIAGNOSIS — J8410 Pulmonary fibrosis, unspecified: Secondary | ICD-10-CM

## 2013-11-25 DIAGNOSIS — I359 Nonrheumatic aortic valve disorder, unspecified: Secondary | ICD-10-CM

## 2013-11-25 DIAGNOSIS — I350 Nonrheumatic aortic (valve) stenosis: Secondary | ICD-10-CM

## 2013-11-25 DIAGNOSIS — R42 Dizziness and giddiness: Secondary | ICD-10-CM

## 2013-11-25 DIAGNOSIS — R079 Chest pain, unspecified: Secondary | ICD-10-CM

## 2013-11-25 DIAGNOSIS — Z7189 Other specified counseling: Secondary | ICD-10-CM

## 2013-11-25 DIAGNOSIS — I2510 Atherosclerotic heart disease of native coronary artery without angina pectoris: Secondary | ICD-10-CM

## 2013-11-25 DIAGNOSIS — I25119 Atherosclerotic heart disease of native coronary artery with unspecified angina pectoris: Secondary | ICD-10-CM

## 2013-11-25 NOTE — Patient Instructions (Signed)
Your physician has requested that you have an echocardiogram. Echocardiography is a painless test that uses sound waves to create images of your heart. It provides your doctor with information about the size and shape of your heart and how well your heart's chambers and valves are working. This procedure takes approximately one hour. There are no restrictions for this procedure. Your physician has requested that you have a lexiscan myoview. For further information please visit [email or web address]. Please follow instruction sheet, as given. Office will contact with results via phone or letter.   Continue all current medications. Follow up in  2-3 weeks

## 2013-11-25 NOTE — Progress Notes (Signed)
Patient ID: Rodney Bowman, male   DOB: 12/11/1921, 80 y.o.   MRN: [id number]       CARDIOLOGY CONSULT NOTE  Patient ID: Rodney Bowman MRN: [id number] DOB/AGE: 1922-03-13 80 y.o.  Admit date: (Not on file) Primary Physician Monico Blitz, MD  Reason for Consultation: Dizziness and hypotension, CAD, aortic stenosis  HPI: The patient is an 80 year old male who I am evaluating for the first time. He is referred for episodic dizziness which has been happening over the past several weeks. He has a history of coronary artery disease and an abdominal aortic aneurysm. He was evaluated by Liz Beach FNP in Dr. Woody Seller' office and was thus referred today. Orthostatic hypotension was effectively ruled out.  An echocardiogram performed at his primary care physician's office on 11/21/2013 reportedly demonstrated normal left ventricular systolic function, EF 02-54%, mild to moderate LVH, grade 1 diastolic dysfunction, mild mitral, aortic, and tricuspid regurgitation. There was also reportedly moderate to severe aortic stenosis with a valve area of 0.8-0.9 cm by the continuity equation. There was a question of the precision of the LVOT measurement however.  ECG performed in the office today demonstrates normal sinus rhythm with old inferior and lateral infarcts.  He has been experiencing exertional and nonexertional chest pain radiating to his arms bilaterally. He had been measuring his blood pressure at home but forgot to bring in these measurements. He has a history of COPD and pulmonary fibrosis and wears 2 L of oxygen chronically. He exercises at the [facility] for 20 minutes 3 days a week using a recumbent bicycle. He's noticed that he has been more dyspneic with exertion, particularly when doing upper body workouts. He denies syncope and leg swelling.  Allergies not on file  Current Outpatient Prescriptions  Medication Sig Dispense Refill  . alfuzosin (UROXATRAL) 10 MG 24 hr tablet Take 1 tablet by  mouth daily.      Marland Kitchen atorvastatin (LIPITOR) 80 MG tablet Take 1 tablet by mouth daily.      . AVODART 0.5 MG capsule Take 1 capsule by mouth daily.      Marland Kitchen levothyroxine (SYNTHROID, LEVOTHROID) 125 MCG tablet Take 1 tablet by mouth daily.      . mycophenolate (CELLCEPT) 500 MG tablet Take 1 tablet by mouth daily.      Marland Kitchen NEXIUM 40 MG capsule Take 1 capsule by mouth daily.      . niacin (NIASPAN) 500 MG CR tablet Take 1 tablet by mouth 2 (two) times daily.      . predniSONE (DELTASONE) 10 MG tablet Take 1 tablet by mouth daily.      Marland Kitchen sulfamethoxazole-trimethoprim (BACTRIM DS) 800-160 MG per tablet Take 1 tablet by mouth daily.       No current facility-administered medications for this visit.    Past Medical History  Diagnosis Date  . AAA (abdominal aortic aneurysm)     Doppler (Dr Trena Platt office 07/2010...stable 3.2)  . Ejection fraction     Normal ejection fraction, echo, October 14, 2010,  Dr Manuella Ghazi office,, reports scan the disc E. MR  . Carotid artery disease     Doppler, Dr. Manuella Ghazi office, June, 2012, report scanned to this EMR, less than 50% bilateral    No past surgical history on file.  History   Social History  . Marital Status: Married    Spouse Name: N/A    Number of Children: N/A  . Years of Education: N/A   Occupational History  . Not on file.  Social History Main Topics  . Smoking status: Not on file  . Smokeless tobacco: Not on file  . Alcohol Use: Not on file  . Drug Use: Not on file  . Sexual Activity: Not on file   Other Topics Concern  . Not on file   Social History Narrative  . No narrative on file     No family history of premature CAD in 1st degree relatives.  Prior to Admission medications   Medication Sig Start Date End Date Taking? Authorizing Provider  alfuzosin (UROXATRAL) 10 MG 24 hr tablet Take 1 tablet by mouth daily. 11/02/13   Historical Provider, MD  atorvastatin (LIPITOR) 80 MG tablet Take 1 tablet by mouth daily. 11/03/13   Historical  Provider, MD  AVODART 0.5 MG capsule Take 1 capsule by mouth daily. 11/14/13   Historical Provider, MD  levothyroxine (SYNTHROID, LEVOTHROID) 125 MCG tablet Take 1 tablet by mouth daily. 11/17/13   Historical Provider, MD  mycophenolate (CELLCEPT) 500 MG tablet Take 1 tablet by mouth daily. 11/14/13   Historical Provider, MD  NEXIUM 40 MG capsule Take 1 capsule by mouth daily. 11/03/13   Historical Provider, MD  niacin (NIASPAN) 500 MG CR tablet Take 1 tablet by mouth 2 (two) times daily. 11/14/13   Historical Provider, MD  predniSONE (DELTASONE) 10 MG tablet Take 1 tablet by mouth daily. 11/21/13   Historical Provider, MD  sulfamethoxazole-trimethoprim (BACTRIM DS) 800-160 MG per tablet Take 1 tablet by mouth daily. 11/14/13   Historical Provider, MD     Review of systems complete and found to be negative unless listed above in HPI     Physical exam Blood pressure 118/69, pulse 62, weight 177 lb (80.287 kg). General: NAD, using oxygen by nasal cannula Neck: No JVD, no thyromegaly or thyroid nodule.  Lungs: Diffuse dry crackles, no wheezes. CV: Nondisplaced PMI. Regular rate and rhythm, normal S1/S2, no S3/S4, IV/VI late-peaking systolic murmur best heard over RUSB.  No peripheral edema.  No carotid bruit.  Normal pedal pulses.  Abdomen: Soft, nontender, no hepatosplenomegaly, no distention.  Skin: Intact without lesions or rashes.  Neurologic: Alert and oriented x 3.  Psych: Normal affect. Extremities: No clubbing or cyanosis.  HEENT: Normal.   Labs:   Lab Results  Component Value Date   WBC 9.8 06/12/2009   HGB 13.8 06/12/2009   HCT 41.2 06/12/2009   MCV 93.0 06/12/2009   PLT 227 06/12/2009   No results found for this basename: NA, K, CL, CO2, BUN, CREATININE, CALCIUM, LABALBU, PROT, BILITOT, ALKPHOS, ALT, AST, GLUCOSE,  in the last 168 hours No results found for this basename: CKTOTAL, CKMB, CKMBINDEX, TROPONINI    No results found for this basename: CHOL   No results found for this  basename: HDL   No results found for this basename: LDLCALC   No results found for this basename: TRIG   No results found for this basename: CHOLHDL   No results found for this basename: LDLDIRECT         Studies: No results found.  ASSESSMENT AND PLAN:  1. Dizziness and hypotension: Quite possibly related to aortic stenosis severity. He is not a candidate for AVR. I will obtain a limited echo to evaluate aortic stenosis severity and then consider referral to Dr. [name] for TAVR consideration.  2. CAD: He has been experiencing both exertional and nonexertional chest pain. Most recent cardiac cath report I am able to locate is dated 2001. I will obtain a Agricultural consultant  for further clarification. Continue aspirin 81 mg and Lipitor 80 mg.  3. AAA: Monitored by PCP.  4. Aortic stenosis: Possibly moderate to severe.  I will obtain a limited echo to evaluate aortic stenosis severity and then consider referral to Dr. [name] for TAVR consideration.  Dispo: f/u 2-3 weeks.  Signed: Kate Sable, M.D., F.A.C.C.  11/25/2013, 2:58 PM

## 2013-12-01 ENCOUNTER — Ambulatory Visit: Payer: PRIVATE HEALTH INSURANCE | Admitting: Cardiology

## 2013-12-02 ENCOUNTER — Ambulatory Visit (HOSPITAL_COMMUNITY)
Admission: RE | Admit: 2013-12-02 | Discharge: 2013-12-02 | Disposition: A | Discharge status: Home / Self Care | Payer: Medicare Other | Source: Ambulatory Visit | Attending: Cardiovascular Disease | Admitting: Cardiovascular Disease

## 2013-12-02 ENCOUNTER — Encounter (HOSPITAL_COMMUNITY): Payer: Self-pay

## 2013-12-02 ENCOUNTER — Encounter (HOSPITAL_COMMUNITY)
Admission: RE | Admit: 2013-12-02 | Discharge: 2013-12-02 | Disposition: A | Discharge status: Home / Self Care | Payer: Medicare Other | Source: Ambulatory Visit | Attending: Cardiovascular Disease | Admitting: Cardiovascular Disease

## 2013-12-02 ENCOUNTER — Telehealth: Payer: Self-pay | Admitting: *Deleted

## 2013-12-02 DIAGNOSIS — I350 Nonrheumatic aortic (valve) stenosis: Secondary | ICD-10-CM

## 2013-12-02 DIAGNOSIS — R42 Dizziness and giddiness: Secondary | ICD-10-CM | POA: Diagnosis not present

## 2013-12-02 DIAGNOSIS — R079 Chest pain, unspecified: Secondary | ICD-10-CM | POA: Insufficient documentation

## 2013-12-02 DIAGNOSIS — I209 Angina pectoris, unspecified: Secondary | ICD-10-CM | POA: Insufficient documentation

## 2013-12-02 DIAGNOSIS — I959 Hypotension, unspecified: Secondary | ICD-10-CM | POA: Diagnosis not present

## 2013-12-02 DIAGNOSIS — I359 Nonrheumatic aortic valve disorder, unspecified: Secondary | ICD-10-CM | POA: Insufficient documentation

## 2013-12-02 DIAGNOSIS — I2510 Atherosclerotic heart disease of native coronary artery without angina pectoris: Secondary | ICD-10-CM | POA: Diagnosis not present

## 2013-12-02 DIAGNOSIS — I25119 Atherosclerotic heart disease of native coronary artery with unspecified angina pectoris: Secondary | ICD-10-CM

## 2013-12-02 MED ORDER — SODIUM CHLORIDE 0.9 % IJ SOLN
10.0000 mL | INTRAMUSCULAR | Status: DC | PRN
  Administered 2013-12-02: 10 mL via INTRAVENOUS

## 2013-12-02 MED ORDER — SODIUM CHLORIDE 0.9 % IJ SOLN
INTRAMUSCULAR | Status: AC
  Administered 2013-12-02: 10 mL via INTRAVENOUS
  Filled 2013-12-02: qty 10

## 2013-12-02 MED ORDER — REGADENOSON 0.4 MG/5ML IV SOLN
INTRAVENOUS | Status: AC
  Administered 2013-12-02: 0.4 mg via INTRAVENOUS
  Filled 2013-12-02: qty 5

## 2013-12-02 MED ORDER — TECHNETIUM TC 99M SESTAMIBI - CARDIOLITE
30.0000 | Freq: Once | INTRAVENOUS | Status: AC | PRN
  Administered 2013-12-02 (×2): 30 via INTRAVENOUS

## 2013-12-02 MED ORDER — REGADENOSON 0.4 MG/5ML IV SOLN
0.4000 mg | Freq: Once | INTRAVENOUS | Status: AC | PRN
  Administered 2013-12-02: 0.4 mg via INTRAVENOUS

## 2013-12-02 MED ORDER — TECHNETIUM TC 99M SESTAMIBI GENERIC - CARDIOLITE
10.0000 | Freq: Once | INTRAVENOUS | Status: AC | PRN
  Administered 2013-12-02: 10 via INTRAVENOUS

## 2013-12-02 NOTE — Progress Notes (Signed)
  Echocardiogram 2D Echocardiogram limited has been performed.  [place], [place] 12/02/2013, 9:27 AM

## 2013-12-02 NOTE — Telephone Encounter (Signed)
Notes Recorded by Laurine Blazer, LPN on 6/0/0459 at 9:77 PM Patient notified. Follow up scheduled for 12/09/2013 with Dr. Bronson Ing. Explained that MD will discuss results in detail at [place]. ------

## 2013-12-02 NOTE — Progress Notes (Signed)
Stress Lab Nurses Notes - [place] 12/02/2013 Reason for doing test: Hypotension Dizziness, CAD & Aortic Stenosis Type of test: Wille Glaser Nurse performing test: Gerrit Halls, RN Nuclear Medicine Tech: Redmond Baseman Echo Tech: Not Applicable MD performing test: [place]/K.Purcell Nails NP Family MD: Manuella Ghazi Test explained and consent signed: Yes.   IV started: Saline lock flushed, No redness or edema and Saline lock started in radiology Symptoms: nausea Treatment/Intervention: None Reason test stopped: protocol completed After recovery IV was: Discontinued via X-ray tech and No redness or edema Patient to return to Nuc. Med at : 12:00 Patient discharged: Home Patient's Condition upon discharge was: stable Comments: During test BP 128/68 & HR 81.  Recovery BP 137/66 & HR 79.  Symptoms resolved in recovery.  Geanie Cooley T

## 2013-12-02 NOTE — Telephone Encounter (Signed)
Message copied by Laurine Blazer on Fri Dec 02, 2013  6:02 PM ------      Message from: Kate Sable A      Created: Fri Dec 02, 2013 12:19 PM       Will discuss at f/u ov. ------

## 2013-12-09 ENCOUNTER — Encounter: Payer: Self-pay | Admitting: Cardiovascular Disease

## 2013-12-09 ENCOUNTER — Ambulatory Visit (INDEPENDENT_AMBULATORY_CARE_PROVIDER_SITE_OTHER): Payer: Medicare Other | Admitting: Cardiovascular Disease

## 2013-12-09 VITALS — BP 96/49 | HR 60 | Ht 70.0 in | Wt 178.0 lb

## 2013-12-09 DIAGNOSIS — I209 Angina pectoris, unspecified: Secondary | ICD-10-CM

## 2013-12-09 DIAGNOSIS — R0609 Other forms of dyspnea: Secondary | ICD-10-CM

## 2013-12-09 DIAGNOSIS — I25119 Atherosclerotic heart disease of native coronary artery with unspecified angina pectoris: Secondary | ICD-10-CM

## 2013-12-09 DIAGNOSIS — I359 Nonrheumatic aortic valve disorder, unspecified: Secondary | ICD-10-CM

## 2013-12-09 DIAGNOSIS — R079 Chest pain, unspecified: Secondary | ICD-10-CM

## 2013-12-09 DIAGNOSIS — J8410 Pulmonary fibrosis, unspecified: Secondary | ICD-10-CM

## 2013-12-09 DIAGNOSIS — I959 Hypotension, unspecified: Secondary | ICD-10-CM

## 2013-12-09 DIAGNOSIS — R42 Dizziness and giddiness: Secondary | ICD-10-CM

## 2013-12-09 DIAGNOSIS — I714 Abdominal aortic aneurysm, without rupture, unspecified: Secondary | ICD-10-CM

## 2013-12-09 DIAGNOSIS — I2510 Atherosclerotic heart disease of native coronary artery without angina pectoris: Secondary | ICD-10-CM

## 2013-12-09 DIAGNOSIS — I350 Nonrheumatic aortic (valve) stenosis: Secondary | ICD-10-CM

## 2013-12-09 DIAGNOSIS — R0989 Other specified symptoms and signs involving the circulatory and respiratory systems: Secondary | ICD-10-CM

## 2013-12-09 NOTE — Progress Notes (Signed)
Patient ID: Rodney Bowman, male   DOB: 11/26/1921, 80 y.o.   MRN: [id number]      SUBJECTIVE: The patient presents for follow up after undergoing cardiovascular testing performed for the evaluation of dizziness, increasing dyspnea with exertion, and exertional and nonexertional chest pain. Lexiscan Cardiolite stress test was normal. Echocardiography demonstrated normal left ventricular systolic function, EF 02-40%, moderate LVH, grade 1 diastolic dysfunction, elevated left ventricular filling pressures, and moderate to severe aortic stenosis from a morphologic standpoint with clearly limited cusp excursion. It appeared that peak velocities and mean gradients were underestimated. He notices that his dyspnea with exertion has slowly gotten worse over the last few months, as has his chest discomfort.   Review of Systems: As per "subjective", otherwise negative.  No Known Allergies  Current Outpatient Prescriptions  Medication Sig Dispense Refill  . alfuzosin (UROXATRAL) 10 MG 24 hr tablet Take 1 tablet by mouth daily.      Marland Kitchen aspirin 81 MG tablet 1/2 TAB PO QD      . atorvastatin (LIPITOR) 80 MG tablet Take 1 tablet by mouth daily.      Marland Kitchen CALCIUM PO Take 1 tablet by mouth 3 (three) times daily.      . Cyanocobalamin (VITAMIN B-12 PO) Take 1 tablet by mouth daily.      Marland Kitchen levothyroxine (SYNTHROID, LEVOTHROID) 125 MCG tablet 1/2 TAB PO QD      . Multiple Vitamin (MULTIVITAMIN) capsule Take 1 capsule by mouth daily.      . mycophenolate (CELLCEPT) 500 MG tablet Take 2 tablets by mouth daily.       Marland Kitchen NEXIUM 40 MG capsule Take 1 capsule by mouth daily.      . niacin (NIASPAN) 500 MG CR tablet Take 1 tablet by mouth at bedtime.       . Omega-3 Fatty Acids (FISH OIL PO) Take 1 tablet by mouth daily.      . OXYGEN-HELIUM IN 2 L.      . predniSONE (DELTASONE) 10 MG tablet Take 1 tablet by mouth daily.      Marland Kitchen sulfamethoxazole-trimethoprim (BACTRIM DS) 800-160 MG per tablet Take 1 tablet by mouth 3  (three) times a week.        No current facility-administered medications for this visit.    Past Medical History  Diagnosis Date  . AAA (abdominal aortic aneurysm)     Doppler (Dr Trena Platt office 07/2010...stable 3.2)  . Ejection fraction     Normal ejection fraction, echo, October 14, 2010,  Dr Manuella Ghazi office,, reports scan the disc E. MR  . Carotid artery disease     Doppler, Dr. Manuella Ghazi office, June, 2012, report scanned to this EMR, less than 50% bilateral  . Severe aortic stenosis 07/02/2010    Qualifier: Diagnosis of  By: Ron Parker, MD, [facility], [place]     No past surgical history on file.  History   Social History  . Marital Status: Married    Spouse Name: N/A    Number of Children: N/A  . Years of Education: N/A   Occupational History  . Not on file.   Social History Main Topics  . Smoking status: Former Smoker    Quit date: 12/09/1964  . Smokeless tobacco: Not on file  . Alcohol Use: No  . Drug Use: No  . Sexual Activity: Not on file   Other Topics Concern  . Not on file   Social History Narrative  . No narrative on file  Filed Vitals:   12/09/13 0832  BP: 96/49  Pulse: 60  Height: 5\' 10"  (1.778 m)  Weight: 178 lb (80.74 kg)  SpO2: 97%    PHYSICAL EXAM General: NAD, using oxygen by nasal cannula  Neck: No JVD, no thyromegaly or thyroid nodule.  Lungs: Diffuse dry crackles, no wheezes.  CV: Nondisplaced PMI. Regular rate and rhythm, normal S1/S2, no S3/S4, IV/VI late-peaking systolic murmur best heard over RUSB. No peripheral edema. No carotid bruit. Normal pedal pulses.  Abdomen: Soft, nontender, no hepatosplenomegaly, no distention.  Skin: Intact without lesions or rashes.  Neurologic: Alert and oriented x 3.  Psych: Normal affect.  Extremities: No clubbing or cyanosis.  HEENT: Normal.   ECG: reviewed and available in electronic records.      ASSESSMENT AND PLAN: 1. Dizziness, chest pain, hypotension, and dyspnea with exertion in the context of  moderate to severe aortic stenosis: He is likely not a candidate for surgical aortic valve replacement. There was also a discrepancy between the morphologic appearance of the aortic valve and the peak velocities and mean gradients. Again, morphologically, there was significantly reduced cusp excursion with what appeared to be moderate to severe aortic stenosis. The most optimal way of reconciling these differences is by performing left and right cardiac catheterization. Lexiscan Cardiolite stress test was normal. Prior to having him undergo any additional procedures, I will make a referral to Dr. [name] for TAVR consideration.   2. CAD: He has been experiencing both exertional and nonexertional chest pain, likely related to aortic stenosis. Most recent cardiac cath report I am able to locate is dated 2001. Lexiscan Cardiolite stress test was normal. I would not add long-acting nitrates given his low normal BP, and do not feel this pain is related to angina per se. Continue aspirin 81 mg and Lipitor 80 mg.   3. AAA: Monitored by PCP.    Dispo: f/u 2-3 months.  Kate Sable, M.D., F.A.C.C.

## 2013-12-09 NOTE — Patient Instructions (Addendum)
Your physician recommends that you schedule a follow-up appointment in: 2-3 months. Your physician recommends that you continue on your current medications as directed. Please refer to the Current Medication list given to you today. You have been referred to Dr. Burt Knack for TAVR consideration. (Transcather Aortic Valve Replacement) Transcatheter Aortic Valve Replacement (TAVR) is a procedure for select patients with severe symptomatic aortic stenosis (narrowing of the aortic valve opening).

## 2013-12-13 ENCOUNTER — Ambulatory Visit (INDEPENDENT_AMBULATORY_CARE_PROVIDER_SITE_OTHER): Payer: Medicare Other | Admitting: Cardiovascular Disease

## 2013-12-13 ENCOUNTER — Encounter: Payer: Self-pay | Admitting: Cardiovascular Disease

## 2013-12-13 VITALS — BP 104/60 | HR 70 | Ht 70.0 in | Wt 178.6 lb

## 2013-12-13 DIAGNOSIS — I359 Nonrheumatic aortic valve disorder, unspecified: Secondary | ICD-10-CM

## 2013-12-13 LAB — BASIC METABOLIC PANEL
BUN: 12 mg/dL (ref 6–23)
CHLORIDE: 104 meq/L (ref 96–112)
CO2: 25 mEq/L (ref 19–32)
Calcium: 9.2 mg/dL (ref 8.4–10.5)
Creatinine, Ser: 1.7 mg/dL — ABNORMAL HIGH (ref 0.4–1.5)
GFR: 40.74 mL/min — ABNORMAL LOW (ref >60.00)
Glucose, Bld: 112 mg/dL — ABNORMAL HIGH (ref 70–99)
POTASSIUM: 4.5 meq/L (ref 3.5–5.1)
Sodium: 137 mEq/L (ref 135–145)

## 2013-12-13 LAB — CBC
HEMATOCRIT: 35.6 % — AB (ref 39.0–52.0)
HEMOGLOBIN: 11.7 g/dL — AB (ref 13.0–17.0)
MCHC: 33 g/dL (ref 30.0–36.0)
MCV: 96 fl (ref 78.0–100.0)
PLATELETS: 192 10*3/uL (ref 150.0–400.0)
RBC: 3.71 Mil/uL — AB (ref 4.22–5.81)
RDW: 14.8 % (ref 11.5–15.5)
WBC: 11.7 10*3/uL — AB (ref 4.0–10.5)

## 2013-12-13 LAB — PROTIME-INR
INR: 1 ratio (ref 0.8–1.0)
Prothrombin Time: 11.2 s (ref 9.6–13.1)

## 2013-12-13 NOTE — Patient Instructions (Addendum)
Your physician has requested that you have a cardiac catheterization. Cardiac catheterization is used to diagnose and/or treat various heart conditions. Doctors may recommend this procedure for a number of different reasons. The most common reason is to evaluate chest pain. Chest pain can be a symptom of coronary artery disease (CAD), and cardiac catheterization can show whether plaque is narrowing or blocking your heart's arteries. This procedure is also used to evaluate the valves, as well as measure the blood flow and oxygen levels in different parts of your heart. For further information please visit www.[email or web address]. Please follow instruction sheet, as given.  Your physician recommends that you continue on your current medications as directed. Please refer to the Current Medication list given to you today.  Your physician recommends that you have lab work today: BMP, CBC, PT/INR   

## 2013-12-13 NOTE — Progress Notes (Signed)
HPI:  80 year-old gentleman referred for evaluation of severe aortic stenosis and consideration of TAVR by Dr Bronson Ing. The patient has a longstanding history of CAD. He initially underwent PTCA of the RCA at [facility] in 1993. Repeat cath at [facility] in 2001 demonstrated subtotal occlusion of the RCA, 70% stenosis of the LCx, and nonobstructive LAD stenosis. Medical therapy was recommended and the patient has had no ischemic events since that time.   He was diagnosed with pulmonary fibrosis in 2009 and has been followed at [facility] by Dr Randol Kern. He's treated with chronic prednisone and  Mycophenolate. Most recent DLCO was 29% predicted. The patient is O2 dependent.   He notes progressive shortness of breath over the past 6-12 months. He denies orthopnea or PND, but has mild chronic leg edema without recent change. He denies chest pain with exertion. However, he's had some episodes of resting chest and left arm discomfort that he describes as an 'ache.' He does have a history of lightheadedness and has had 2 syncopal episodes in the past. His last episode of syncope was about one year ago and it occurred after walking up a flight of stairs. He notes that his blood pressure has been running lower and he is more fatigued over recent months.   The patient is here with his wife today. They have lived in [place] for many years and he is retired from the [employer] after 62 years of work. He goes to the [facility] 3 days per week and walks on the treadmill for approximately 20 minutes. He's had to stop and rest more frequently over recent months.     Outpatient Encounter Prescriptions as of 12/13/2013  Medication Sig  . alfuzosin (UROXATRAL) 10 MG 24 hr tablet Take 1 tablet by mouth daily.  Marland Kitchen aspirin 81 MG tablet 1/2 TAB PO QD  . atorvastatin (LIPITOR) 80 MG tablet Take 1 tablet by mouth daily.  Marland Kitchen CALCIUM PO Take 1 tablet by mouth 3 (three) times daily.  .  Cyanocobalamin (VITAMIN B-12 PO) Take 1 tablet by mouth daily.  Marland Kitchen levothyroxine (SYNTHROID, LEVOTHROID) 125 MCG tablet 1/2 TAB PO QD  . Multiple Vitamin (MULTIVITAMIN) capsule Take 1 capsule by mouth daily.  . mycophenolate (CELLCEPT) 500 MG tablet Take 2 tablets by mouth daily.   Marland Kitchen NEXIUM 40 MG capsule Take 1 capsule by mouth daily.  . niacin (NIASPAN) 500 MG CR tablet Take 1 tablet by mouth at bedtime.   . Omega-3 Fatty Acids (FISH OIL PO) Take 1 tablet by mouth daily.  . OXYGEN-HELIUM IN 2 L.  . predniSONE (DELTASONE) 10 MG tablet Take 1 tablet by mouth daily.  Marland Kitchen sulfamethoxazole-trimethoprim (BACTRIM DS) 800-160 MG per tablet Take 1 tablet by mouth 3 (three) times a week.     Review of patient's allergies indicates no known allergies.  Past Medical History  Diagnosis Date  . AAA (abdominal aortic aneurysm)     Doppler (Dr Trena Platt office 07/2010...stable 3.2)  . Ejection fraction     Normal ejection fraction, echo, October 14, 2010,  Dr Manuella Ghazi office,, reports scan the disc E. MR  . Carotid artery disease     Doppler, Dr. Manuella Ghazi office, June, 2012, report scanned to this EMR, less than 50% bilateral  . Severe aortic stenosis 07/02/2010    Qualifier: Diagnosis of  By: Ron Parker, MD, [facility], [place]     No past surgical history on file.  History   Social History  .  Marital Status: Married    Spouse Name: N/A    Number of Children: N/A  . Years of Education: N/A   Occupational History  . Not on file.   Social History Main Topics  . Smoking status: Former Smoker    Quit date: 12/09/1964  . Smokeless tobacco: Not on file  . Alcohol Use: No  . Drug Use: No  . Sexual Activity: Not on file   Other Topics Concern  . Not on file   Social History Narrative  . No narrative on file    Family history: negative for premature CAD  ROS: General: no fevers/chills/night sweats, positive for fatigue Eyes: no blurry vision, diplopia, or amaurosis ENT: no sore throat or hearing  loss Resp: positive for cough, no wheezing or hemoptysis CV: no palpitations GI: no abdominal pain, nausea, vomiting, diarrhea, or constipation GU: no dysuria, frequency, or hematuria Skin: no rash Neuro: no headache, numbness, tingling, or weakness of extremities Musculoskeletal: no joint pain or swelling Heme: no bleeding, DVT, or easy bruising Endo: no polydipsia or polyuria  BP 104/60  Pulse 70  Ht 5\' 10"  (1.778 m)  Wt 178 lb 9.6 oz (81.012 kg)  BMI 25.63 kg/m2  PHYSICAL EXAM: Pt is alert and oriented, pleasant elderly male, in no distress. HEENT: normal Neck: JVP normal. Carotid upstrokes normal with bilateral bruits. No thyromegaly. Lungs: equal expansion, clear bilaterally CV: Apex is discrete and nondisplaced, RRR with grade 3/6 harsh systolic murmur at the RUSB Abd: soft, NT, +BS, no bruit, no hepatosplenomegaly Back: no CVA tenderness Ext: 1+ pretibial edema bilaterally        Femoral pulses 2+=         DP/PT pulses intact and = Skin: warm and dry without rash Neuro: CNII-XII intact             Strength intact = bilaterally  Myoview Stress Test 12/02/2013: IMPRESSION:  1. Normal Lexiscan Cardiolite stress test.  2. No evidence of myocardial ischemia or scar.  3. Normal left ventricular systolic function and regional wall  motion, calculated LVEF 76%.  2D Echo 12/02/2013: Study Conclusions  - Left ventricle: The cavity size was normal. Wall thickness was increased in a pattern of moderate LVH. Systolic function was normal. The estimated ejection fraction was in the range of 60% to 65%. Wall motion was normal; there were no regional wall motion abnormalities. Doppler parameters are consistent with abnormal left ventricular relaxation (grade 1 diastolic dysfunction). Doppler parameters are consistent with high ventricular filling pressure. - Aortic valve: Morphologically, there appears to be moderate to severe aortic stenosis. Peak velocities and mean gradients  are likely underestimated. Limited cusp excursion is noted. Moderately calcified annulus. Trileaflet; mildly thickened, moderately calcified leaflets. There was mild regurgitation. Peak velocity (S): 275 cm/s. Mean gradient (S): 16 mm Hg. Valve area (VTI): 1.03 cm^2. Valve area (Vmax): 0.95 cm^2. - Mitral valve: Moderately calcified annulus, particularly posteriorly. Normal thickness leaflets . There was mild regurgitation. - Left atrium: The atrium was mildly to moderately dilated.  Cardiac Cath 09/24/1999: ANGIOGRAPHY: There was heavy calcium throughout the coronary bed.  1. Left main: The left main had a 30% distal stenosis.  2. Left anterior descending: The LAD had a diagonal with an 80% lesion and  the LAD revealed segmental areas of disease but no high-grade stenosis.  The LAD had a 30% ostial lesion.  3. Circumflex: The circumflex had a focal area of a 70-80%.  4. Right coronary artery: The right coronary artery revealed  a long  area of 90% stenosis and 99% subtotal lesion in the distal RCA.  The distal RCA fills through collaterals from the circumflex.  5. Left ventricle: The left ventricle is normal.  6. Abdominal aortogram revealed normal renal arteries. There was a  small to moderate sized abdominal aortic aneurysm.  SUMMARY: Three-vessel coronary disease as noted above with heavy  calcification.  The LV was normal.  There was an abdominal aneurysm.  Cine reviewed with Dr. Lia Foyer, and it was felt that the circumflex lesion was  probably not causing his present symptoms. His RCA was not approachable by  angioplasty and did have moderately good collaterals.  It was felt that his medicines should be further maximized and then followup  Cardiolite, although his family states that he had a recent stress test and  this will be reviewed.  In summary, if there is recurrent symptoms consideration should be given to  percutaneous treatment of the circumflex and diagonal. I do not  think surgery  is indicated at this time.  RISK SCORES About the STS Risk Calculator Procedure: AV Replacement + CAB Risk of Mortality: 11.321% Morbidity or Mortality: 43.127% Long Length of Stay: 28.694% Short Length of Stay: 6.805% Permanent Stroke: 2.117% Prolonged Ventilation: 31.146% DSW Infection: 0.569% Renal Failure: 16.225% Reoperation: 17.374%  ASSESSMENT AND PLAN: 80 year-old gentleman with moderate-severe aortic stenosis and progressive symptoms of exertional dyspnea (NYHA functional class III). He has significant comorbid conditions as outlined above, most notably pulmonary fibrosis with 02 dependence and chronic immunosuppression.  I have personally reviewed the patient's recent echo images. I agree that his aortic valve has the morphologic appearance of moderate to severe aortic stenosis with significant leaflet thickening, restricted motion, and calcification. However, gradients are lower than expected in the setting of normal LV systolic function. Symptoms are also concerning for aortic stenosis, progressive CAD, or a combination of the two.   I have recommended that we proceed with right and left heart catheterization to better define the severity of his aortic stenosis, define coronary anatomy, and evaluate intracardiac hemodynamics. We have discussed the natural history of aortic stenosis and reviewed potential treatment options extensively. Traditional AVR, TAVR, and medical therapy were reviewed extensively as potential treatment options. If severe aortic stenosis is confirmed, I think it is clear that he would not be a candidate for surgical AVR based on advanced age, high STS predicted mortality risk,  and significant comorbid medical conditions outlined above.  I have reviewed the risks, indications, and alternatives to cardiac catheterization were reviewed with the patient and family. Risks include but are not limited to bleeding, infection, vascular injury, stroke,  myocardial infection, arrhythmia, renal failure, emergency cardiac surgery, and death. The patient understands the risks of serious complication is low (<8%).   Will determine further follow-up pending the results of his cardiac catheterization.  [name] MD 12/13/2013 2:47 PM

## 2013-12-14 ENCOUNTER — Encounter (HOSPITAL_COMMUNITY): Payer: Self-pay | Admitting: Pharmacy Technician

## 2013-12-15 ENCOUNTER — Encounter (HOSPITAL_COMMUNITY)
Admission: RE | Disposition: A | Discharge status: Home / Self Care | Payer: Self-pay | Source: Ambulatory Visit | Attending: Cardiovascular Disease

## 2013-12-15 ENCOUNTER — Encounter: Payer: Self-pay | Admitting: Cardiovascular Disease

## 2013-12-15 ENCOUNTER — Ambulatory Visit (HOSPITAL_COMMUNITY)
Admission: RE | Admit: 2013-12-15 | Discharge: 2013-12-15 | Disposition: A | Discharge status: Home / Self Care | Payer: Medicare Other | Source: Ambulatory Visit | Attending: Cardiovascular Disease | Admitting: Cardiovascular Disease

## 2013-12-15 DIAGNOSIS — I359 Nonrheumatic aortic valve disorder, unspecified: Secondary | ICD-10-CM | POA: Insufficient documentation

## 2013-12-15 DIAGNOSIS — Z9981 Dependence on supplemental oxygen: Secondary | ICD-10-CM | POA: Insufficient documentation

## 2013-12-15 DIAGNOSIS — J8410 Pulmonary fibrosis, unspecified: Secondary | ICD-10-CM | POA: Insufficient documentation

## 2013-12-15 DIAGNOSIS — Z87891 Personal history of nicotine dependence: Secondary | ICD-10-CM | POA: Insufficient documentation

## 2013-12-15 DIAGNOSIS — I714 Abdominal aortic aneurysm, without rupture, unspecified: Secondary | ICD-10-CM | POA: Diagnosis not present

## 2013-12-15 DIAGNOSIS — IMO0002 Reserved for concepts with insufficient information to code with codable children: Secondary | ICD-10-CM | POA: Insufficient documentation

## 2013-12-15 DIAGNOSIS — Z9861 Coronary angioplasty status: Secondary | ICD-10-CM | POA: Insufficient documentation

## 2013-12-15 DIAGNOSIS — I6529 Occlusion and stenosis of unspecified carotid artery: Secondary | ICD-10-CM | POA: Insufficient documentation

## 2013-12-15 DIAGNOSIS — I2510 Atherosclerotic heart disease of native coronary artery without angina pectoris: Secondary | ICD-10-CM | POA: Diagnosis not present

## 2013-12-15 DIAGNOSIS — N189 Chronic kidney disease, unspecified: Secondary | ICD-10-CM | POA: Insufficient documentation

## 2013-12-15 DIAGNOSIS — Z7982 Long term (current) use of aspirin: Secondary | ICD-10-CM | POA: Diagnosis not present

## 2013-12-15 HISTORY — PX: LEFT AND RIGHT HEART CATHETERIZATION WITH CORONARY ANGIOGRAM: SHX5449

## 2013-12-15 LAB — POCT I-STAT 3, ART BLOOD GAS (G3+)
Acid-base deficit: 2 mmol/L (ref 0.0–2.0)
BICARBONATE: 23.7 meq/L (ref 20.0–24.0)
O2 Saturation: 94 %
PCO2 ART: 41.2 mmHg (ref 35.0–45.0)
TCO2: 25 mmol/L (ref 0–100)
pH, Arterial: 7.367 (ref 7.350–7.450)
pO2, Arterial: 74 mmHg — ABNORMAL LOW (ref 80.0–100.0)

## 2013-12-15 LAB — POCT I-STAT 3, VENOUS BLOOD GAS (G3P V)
ACID-BASE DEFICIT: 1 mmol/L (ref 0.0–2.0)
BICARBONATE: 24.4 meq/L — AB (ref 20.0–24.0)
O2 Saturation: 68 %
TCO2: 26 mmol/L (ref 0–100)
pCO2, Ven: 44.3 mmHg — ABNORMAL LOW (ref 45.0–50.0)
pH, Ven: 7.35 — ABNORMAL HIGH (ref 7.250–7.300)
pO2, Ven: 38 mmHg (ref 30.0–45.0)

## 2013-12-15 SURGERY — LEFT AND RIGHT HEART CATHETERIZATION WITH CORONARY ANGIOGRAM
Anesthesia: LOCAL

## 2013-12-15 MED ORDER — FENTANYL CITRATE 0.05 MG/ML IJ SOLN
INTRAMUSCULAR | Status: AC
  Filled 2013-12-15: qty 2

## 2013-12-15 MED ORDER — ASPIRIN 81 MG PO CHEW
81.0000 mg | CHEWABLE_TABLET | ORAL | Status: AC
  Administered 2013-12-15: 81 mg via ORAL

## 2013-12-15 MED ORDER — SODIUM CHLORIDE 0.9 % IJ SOLN: 3.0000 mL | Freq: Two times a day (BID) | INTRAMUSCULAR | Status: DC

## 2013-12-15 MED ORDER — SODIUM CHLORIDE 0.9 % IV SOLN
INTRAVENOUS | Status: DC
  Administered 2013-12-15: 08:00:00 via INTRAVENOUS

## 2013-12-15 MED ORDER — ONDANSETRON HCL 4 MG/2ML IJ SOLN: 4.0000 mg | Freq: Four times a day (QID) | INTRAMUSCULAR | Status: DC | PRN

## 2013-12-15 MED ORDER — MIDAZOLAM HCL 2 MG/2ML IJ SOLN
INTRAMUSCULAR | Status: AC
  Filled 2013-12-15: qty 2

## 2013-12-15 MED ORDER — SODIUM CHLORIDE 0.9 % IV SOLN: 250.0000 mL | INTRAVENOUS | Status: DC | PRN

## 2013-12-15 MED ORDER — HEPARIN SODIUM (PORCINE) 1000 UNIT/ML IJ SOLN
INTRAMUSCULAR | Status: AC
  Filled 2013-12-15: qty 1

## 2013-12-15 MED ORDER — HEPARIN (PORCINE) IN NACL 2-0.9 UNIT/ML-% IJ SOLN
INTRAMUSCULAR | Status: AC
  Filled 2013-12-15: qty 1000

## 2013-12-15 MED ORDER — VERAPAMIL HCL 2.5 MG/ML IV SOLN
INTRAVENOUS | Status: AC
  Filled 2013-12-15: qty 2

## 2013-12-15 MED ORDER — LIDOCAINE HCL (PF) 1 % IJ SOLN
INTRAMUSCULAR | Status: AC
  Filled 2013-12-15: qty 30

## 2013-12-15 MED ORDER — SODIUM CHLORIDE 0.9 % IJ SOLN: 3.0000 mL | INTRAMUSCULAR | Status: DC | PRN

## 2013-12-15 MED ORDER — ACETAMINOPHEN 325 MG PO TABS: 650.0000 mg | ORAL_TABLET | ORAL | Status: DC | PRN

## 2013-12-15 MED ORDER — SODIUM CHLORIDE 0.9 % IV SOLN: 1.0000 mL/kg/h | INTRAVENOUS | Status: DC

## 2013-12-15 MED ORDER — ASPIRIN 81 MG PO CHEW
CHEWABLE_TABLET | ORAL | Status: AC
  Filled 2013-12-15: qty 1

## 2013-12-15 NOTE — Discharge Instructions (Signed)
Radial Site Care °Refer to this sheet in the next few weeks. These instructions provide you with information on caring for yourself after your procedure. Your caregiver may also give you more specific instructions. Your treatment has been planned according to current medical practices, but problems sometimes occur. Call your caregiver if you have any problems or questions after your procedure. °HOME CARE INSTRUCTIONS °· You may shower the day after the procedure. Remove the bandage (dressing) and gently wash the site with plain soap and water. Gently pat the site dry. °· Do not apply powder or lotion to the site. °· Do not submerge the affected site in water for 3 to 5 days. °· Inspect the site at least twice daily. °· Do not flex or bend the affected arm for 24 hours. °· No lifting over 5 pounds (2.3 kg) for 5 days after your procedure. °· Do not drive home if you are discharged the same day of the procedure. Have someone else drive you. °· You may drive 24 hours after the procedure unless otherwise instructed by your caregiver. °· Do not operate machinery or power tools for 24 hours. °· A responsible adult should be with you for the first 24 hours after you arrive home. °What to expect: °· Any bruising will usually fade within 1 to 2 weeks. °· Blood that collects in the tissue (hematoma) may be painful to the touch. It should usually decrease in size and tenderness within 1 to 2 weeks. °SEEK IMMEDIATE MEDICAL CARE IF: °· You have unusual pain at the radial site. °· You have redness, warmth, swelling, or pain at the radial site. °· You have drainage (other than a small amount of blood on the dressing). °· You have chills. °· You have a fever or persistent symptoms for more than 72 hours. °· You have a fever and your symptoms suddenly get worse. °· Your arm becomes pale, cool, tingly, or numb. °· You have heavy bleeding from the site. Hold pressure on the site. °Document Released: 05/17/2010 Document Revised:  07/07/2011 Document Reviewed: 05/17/2010 °Exit[employer]® Patient Information ©2015 [employer]. This information is not intended to replace advice given to you by your health care provider. Make sure you discuss any questions you have with your health care provider. ° °

## 2013-12-15 NOTE — H&P (View-Only) (Signed)
HPI:  80 year-old gentleman referred for evaluation of severe aortic stenosis and consideration of TAVR by Dr Bronson Ing. The patient has a longstanding history of CAD. He initially underwent PTCA of the RCA at [facility] in 1993. Repeat cath at [facility] in 2001 demonstrated subtotal occlusion of the RCA, 70% stenosis of the LCx, and nonobstructive LAD stenosis. Medical therapy was recommended and the patient has had no ischemic events since that time.   He was diagnosed with pulmonary fibrosis in 2009 and has been followed at [facility] by Dr Randol Kern. He's treated with chronic prednisone and  Mycophenolate. Most recent DLCO was 29% predicted. The patient is O2 dependent.   He notes progressive shortness of breath over the past 6-12 months. He denies orthopnea or PND, but has mild chronic leg edema without recent change. He denies chest pain with exertion. However, he's had some episodes of resting chest and left arm discomfort that he describes as an 'ache.' He does have a history of lightheadedness and has had 2 syncopal episodes in the past. His last episode of syncope was about one year ago and it occurred after walking up a flight of stairs. He notes that his blood pressure has been running lower and he is more fatigued over recent months.   The patient is here with his wife today. They have lived in [place] for many years and he is retired from the [employer] after 88 years of work. He goes to the [facility] 3 days per week and walks on the treadmill for approximately 20 minutes. He's had to stop and rest more frequently over recent months.     Outpatient Encounter Prescriptions as of 12/13/2013  Medication Sig  . alfuzosin (UROXATRAL) 10 MG 24 hr tablet Take 1 tablet by mouth daily.  Marland Kitchen aspirin 81 MG tablet 1/2 TAB PO QD  . atorvastatin (LIPITOR) 80 MG tablet Take 1 tablet by mouth daily.  Marland Kitchen CALCIUM PO Take 1 tablet by mouth 3 (three) times daily.  .  Cyanocobalamin (VITAMIN B-12 PO) Take 1 tablet by mouth daily.  Marland Kitchen levothyroxine (SYNTHROID, LEVOTHROID) 125 MCG tablet 1/2 TAB PO QD  . Multiple Vitamin (MULTIVITAMIN) capsule Take 1 capsule by mouth daily.  . mycophenolate (CELLCEPT) 500 MG tablet Take 2 tablets by mouth daily.   Marland Kitchen NEXIUM 40 MG capsule Take 1 capsule by mouth daily.  . niacin (NIASPAN) 500 MG CR tablet Take 1 tablet by mouth at bedtime.   . Omega-3 Fatty Acids (FISH OIL PO) Take 1 tablet by mouth daily.  . OXYGEN-HELIUM IN 2 L.  . predniSONE (DELTASONE) 10 MG tablet Take 1 tablet by mouth daily.  Marland Kitchen sulfamethoxazole-trimethoprim (BACTRIM DS) 800-160 MG per tablet Take 1 tablet by mouth 3 (three) times a week.     Review of patient's allergies indicates no known allergies.  Past Medical History  Diagnosis Date  . AAA (abdominal aortic aneurysm)     Doppler (Dr Trena Platt office 07/2010...stable 3.2)  . Ejection fraction     Normal ejection fraction, echo, October 14, 2010,  Dr Manuella Ghazi office,, reports scan the disc E. MR  . Carotid artery disease     Doppler, Dr. Manuella Ghazi office, June, 2012, report scanned to this EMR, less than 50% bilateral  . Severe aortic stenosis 07/02/2010    Qualifier: Diagnosis of  By: Ron Parker, MD, [facility], [place]     No past surgical history on file.  History   Social History  .  Marital Status: Married    Spouse Name: N/A    Number of Children: N/A  . Years of Education: N/A   Occupational History  . Not on file.   Social History Main Topics  . Smoking status: Former Smoker    Quit date: 12/09/1964  . Smokeless tobacco: Not on file  . Alcohol Use: No  . Drug Use: No  . Sexual Activity: Not on file   Other Topics Concern  . Not on file   Social History Narrative  . No narrative on file    Family history: negative for premature CAD  ROS: General: no fevers/chills/night sweats, positive for fatigue Eyes: no blurry vision, diplopia, or amaurosis ENT: no sore throat or hearing  loss Resp: positive for cough, no wheezing or hemoptysis CV: no palpitations GI: no abdominal pain, nausea, vomiting, diarrhea, or constipation GU: no dysuria, frequency, or hematuria Skin: no rash Neuro: no headache, numbness, tingling, or weakness of extremities Musculoskeletal: no joint pain or swelling Heme: no bleeding, DVT, or easy bruising Endo: no polydipsia or polyuria  BP 104/60  Pulse 70  Ht 5\' 10"  (1.778 m)  Wt 178 lb 9.6 oz (81.012 kg)  BMI 25.63 kg/m2  PHYSICAL EXAM: Pt is alert and oriented, pleasant elderly male, in no distress. HEENT: normal Neck: JVP normal. Carotid upstrokes normal with bilateral bruits. No thyromegaly. Lungs: equal expansion, clear bilaterally CV: Apex is discrete and nondisplaced, RRR with grade 3/6 harsh systolic murmur at the RUSB Abd: soft, NT, +BS, no bruit, no hepatosplenomegaly Back: no CVA tenderness Ext: 1+ pretibial edema bilaterally        Femoral pulses 2+=         DP/PT pulses intact and = Skin: warm and dry without rash Neuro: CNII-XII intact             Strength intact = bilaterally  Myoview Stress Test 12/02/2013: IMPRESSION:  1. Normal Lexiscan Cardiolite stress test.  2. No evidence of myocardial ischemia or scar.  3. Normal left ventricular systolic function and regional wall  motion, calculated LVEF 76%.  2D Echo 12/02/2013: Study Conclusions  - Left ventricle: The cavity size was normal. Wall thickness was increased in a pattern of moderate LVH. Systolic function was normal. The estimated ejection fraction was in the range of 60% to 65%. Wall motion was normal; there were no regional wall motion abnormalities. Doppler parameters are consistent with abnormal left ventricular relaxation (grade 1 diastolic dysfunction). Doppler parameters are consistent with high ventricular filling pressure. - Aortic valve: Morphologically, there appears to be moderate to severe aortic stenosis. Peak velocities and mean gradients  are likely underestimated. Limited cusp excursion is noted. Moderately calcified annulus. Trileaflet; mildly thickened, moderately calcified leaflets. There was mild regurgitation. Peak velocity (S): 275 cm/s. Mean gradient (S): 16 mm Hg. Valve area (VTI): 1.03 cm^2. Valve area (Vmax): 0.95 cm^2. - Mitral valve: Moderately calcified annulus, particularly posteriorly. Normal thickness leaflets . There was mild regurgitation. - Left atrium: The atrium was mildly to moderately dilated.  Cardiac Cath 09/24/1999: ANGIOGRAPHY: There was heavy calcium throughout the coronary bed.  1. Left main: The left main had a 30% distal stenosis.  2. Left anterior descending: The LAD had a diagonal with an 80% lesion and  the LAD revealed segmental areas of disease but no high-grade stenosis.  The LAD had a 30% ostial lesion.  3. Circumflex: The circumflex had a focal area of a 70-80%.  4. Right coronary artery: The right coronary artery revealed  a long  area of 90% stenosis and 99% subtotal lesion in the distal RCA.  The distal RCA fills through collaterals from the circumflex.  5. Left ventricle: The left ventricle is normal.  6. Abdominal aortogram revealed normal renal arteries. There was a  small to moderate sized abdominal aortic aneurysm.  SUMMARY: Three-vessel coronary disease as noted above with heavy  calcification.  The LV was normal.  There was an abdominal aneurysm.  Cine reviewed with Dr. Lia Foyer, and it was felt that the circumflex lesion was  probably not causing his present symptoms. His RCA was not approachable by  angioplasty and did have moderately good collaterals.  It was felt that his medicines should be further maximized and then followup  Cardiolite, although his family states that he had a recent stress test and  this will be reviewed.  In summary, if there is recurrent symptoms consideration should be given to  percutaneous treatment of the circumflex and diagonal. I do not  think surgery  is indicated at this time.  RISK SCORES About the STS Risk Calculator Procedure: AV Replacement + CAB Risk of Mortality: 11.321% Morbidity or Mortality: 43.127% Long Length of Stay: 28.694% Short Length of Stay: 6.805% Permanent Stroke: 2.117% Prolonged Ventilation: 31.146% DSW Infection: 0.569% Renal Failure: 16.225% Reoperation: 17.374%  ASSESSMENT AND PLAN: 80 year-old gentleman with moderate-severe aortic stenosis and progressive symptoms of exertional dyspnea (NYHA functional class III). He has significant comorbid conditions as outlined above, most notably pulmonary fibrosis with 02 dependence and chronic immunosuppression.  I have personally reviewed the patient's recent echo images. I agree that his aortic valve has the morphologic appearance of moderate to severe aortic stenosis with significant leaflet thickening, restricted motion, and calcification. However, gradients are lower than expected in the setting of normal LV systolic function. Symptoms are also concerning for aortic stenosis, progressive CAD, or a combination of the two.   I have recommended that we proceed with right and left heart catheterization to better define the severity of his aortic stenosis, define coronary anatomy, and evaluate intracardiac hemodynamics. We have discussed the natural history of aortic stenosis and reviewed potential treatment options extensively. Traditional AVR, TAVR, and medical therapy were reviewed extensively as potential treatment options. If severe aortic stenosis is confirmed, I think it is clear that he would not be a candidate for surgical AVR based on advanced age, high STS predicted mortality risk,  and significant comorbid medical conditions outlined above.  I have reviewed the risks, indications, and alternatives to cardiac catheterization were reviewed with the patient and family. Risks include but are not limited to bleeding, infection, vascular injury, stroke,  myocardial infection, arrhythmia, renal failure, emergency cardiac surgery, and death. The patient understands the risks of serious complication is low (<4%).   Will determine further follow-up pending the results of his cardiac catheterization.  [name] MD 12/13/2013 2:47 PM

## 2013-12-15 NOTE — Interval H&P Note (Signed)
History and Physical Interval Note:  12/15/2013 11:11 AM  Rodney Bowman  has presented today for surgery, with the diagnosis of aortic stenosis  The various methods of treatment have been discussed with the patient and family. After consideration of risks, benefits and other options for treatment, the patient has consented to  Procedure(s): LEFT AND RIGHT HEART CATHETERIZATION WITH CORONARY ANGIOGRAM (N/A) as a surgical intervention .  The patient's history has been reviewed, patient examined, no change in status, stable for surgery.  I have reviewed the patient's chart and labs.  Questions were answered to the patient's satisfaction.     [name]

## 2013-12-15 NOTE — Progress Notes (Signed)
Site area: right brachial venous access Site Prior to Removal:  Level  0 Pressure Applied For: 10 minutes Manual:   yes Patient Status During Pull:  Stable Post Pull Site:  Level 0 Post Pull Instructions Given:  yes Post Pull Pulses Present: n/a Dressing Applied: [email or web address]  Bedrest begins @ n/a Comments: no complications

## 2013-12-15 NOTE — CV Procedure (Signed)
Cardiac Catheterization Procedure Note  Name: Rodney Bowman MRN: [id number] DOB: 10-09-1921  Procedure: Right Heart Cath, Left Heart Cath, Selective Coronary Angiography  Indication: Aortic stenosis, CAD, progressive dyspnea.   Procedural Details: There was an indwelling IV in a right antecubital vein. Using normal sterile technique, the IV was changed out for a 5 Fr brachial sheath over a 0.018 inch wire. The right wrist was then prepped, draped, and anesthetized with 1% lidocaine. Using the modified Seldinger technique a 5/6 French Slender sheath was placed in the right radial artery. Intra-arterial verapamil was administered through the radial artery sheath. IV heparin was administered after a JR4 catheter was advanced into the central aorta. A Swan-Ganz catheter was used for the right heart catheterization. Standard protocol was followed for recording of right heart pressures and sampling of oxygen saturations. Fick cardiac output was calculated. Standard Judkins catheters were used for selective coronary angiography. A JR 4 catheter was used to direct a straight tip wire across the aortic valve. The JR 4 catheter was pulled back across the aortic valve to assess transaortic valve gradient. There were no immediate procedural complications. The patient was transferred to the post catheterization recovery area for further monitoring.  Procedural Findings: Hemodynamics RA 4 RV 27/4 PA 31/6 mean 20 PCWP 10 LV 143/13 AO 126/55 mean 84  Oxygen saturations: PA 68 AO 94  Cardiac Output (Fick) 6.3  Cardiac Index (Fick) 3.2  Aortic valve hemodynamics: Peak-to-peak gradient: 14 mmHg Mean gradient 14 mm Hg Calculated AVA 1.9 square cm   Coronary angiography: Coronary dominance: right  On plain fluoroscopy, the coronary arteries and aortic valve are heavily calcified. The ascending aorta also has severe calcification. The mitral annulus has moderate calcification.  Left mainstem:  The left main is moderately calcified. The vessel divides into the LAD and left circumflex. There is mild diffuse 20-30% left main stenosis noted.  Left anterior descending (LAD): The LAD is heavily calcified. The ostium of the LAD has 30-40% stenosis the The vessel has scattered plaquing throughout the proximal and mid portions. There is diffuse 30-40% stenosis proximally. The mid vessel has diffuse irregularity without high-grade stenosis. The LAD wraps around the left ventricular apex. The first diagonal branches medium in caliber and it divides into twin vessels. There is no significant stenosis.  Left circumflex (LCx): The left circumflex has mild calcification. The ostium has approximately 30% stenosis. The proximal vessel is widely patent. The mid vessel at the origin of the first obtuse marginal branch has 50-60% stenosis. The OM branch divides into twin vessels.  Right coronary artery (RCA): The proximal vessel is patent. The mid vessel has severe diffuse stenosis. At the junction of the mid and distal vessel there is subtotal occlusion with competitive antegrade and left to right collateral filling. The PDA and PLA branches fill primarily from left to right collaterals.  Left ventriculography: Deferred because of chronic kidney disease  Contrast: 35 cc Omnipaque  Radiation dose/Fluoro time: 4.3 minutes  Estimated Blood Loss: Minimal  Final Conclusions:   1. Chronic subtotal occlusion of the right coronary artery with left to right collaterals 2. Mild to moderate diffuse LAD and left circumflex stenosis 3. Heavily calcified aortic valve with hemodynamics consistent with mild aortic stenosis.  Recommendations: Considering the patient's exam, noninvasive studies, an invasive hemodynamics, I suspect he has moderate aortic stenosis. Would recommend continued observation of his aortic stenosis and medical treatment of his coronary artery disease. I suspect his transaortic valve gradients  estimated by  echocardiography are accurate and they seem to correlate fairly well with invasive hemodynamics.  [name] MD, [facility] 12/15/2013, 11:45 AM

## 2014-02-09 ENCOUNTER — Encounter: Payer: Self-pay | Admitting: Cardiovascular Disease

## 2014-02-23 ENCOUNTER — Ambulatory Visit: Payer: Medicare Other | Admitting: Cardiovascular Disease

## 2014-03-01 HISTORY — PX: OTHER SURGICAL HISTORY: SHX169

## 2014-03-08 ENCOUNTER — Ambulatory Visit (INDEPENDENT_AMBULATORY_CARE_PROVIDER_SITE_OTHER): Payer: Medicare Other | Admitting: Cardiovascular Disease

## 2014-03-08 ENCOUNTER — Encounter: Payer: Self-pay | Admitting: *Deleted

## 2014-03-08 VITALS — BP 131/65 | HR 70 | Ht 70.5 in | Wt 181.0 lb

## 2014-03-08 DIAGNOSIS — I2510 Atherosclerotic heart disease of native coronary artery without angina pectoris: Secondary | ICD-10-CM

## 2014-03-08 DIAGNOSIS — I714 Abdominal aortic aneurysm, without rupture, unspecified: Secondary | ICD-10-CM

## 2014-03-08 DIAGNOSIS — R0609 Other forms of dyspnea: Secondary | ICD-10-CM

## 2014-03-08 DIAGNOSIS — I25119 Atherosclerotic heart disease of native coronary artery with unspecified angina pectoris: Secondary | ICD-10-CM

## 2014-03-08 DIAGNOSIS — J8410 Pulmonary fibrosis, unspecified: Secondary | ICD-10-CM

## 2014-03-08 DIAGNOSIS — I350 Nonrheumatic aortic (valve) stenosis: Secondary | ICD-10-CM

## 2014-03-08 NOTE — Patient Instructions (Signed)
Your physician recommends that you schedule a follow-up appointment in: 3-4 months. Your physician recommends that you continue on your current medications as directed. Please refer to the Current Medication list given to you today.

## 2014-03-08 NOTE — Progress Notes (Signed)
Patient ID: Rodney Bowman, male   DOB: 13-Dec-1921, 80 y.o.   MRN: [id number]      SUBJECTIVE: The patient presents for follow-up of coronary artery disease and aortic stenosis. He underwent left heart catheterization and coronary angiography on 12/15/2013. It demonstrated chronic subtotal occlusion of the right coronary artery with left-to-right collaterals, mild to moderate diffuse LAD and left circumflex coronary artery stenosis, and a heavily calcified aortic valve with hemodynamics consistent with mild aortic stenosis. Dr. Burt Knack, who performed the procedure, commented that he suspected the patient had moderate aortic stenosis, consistent with echocardiography hemodynamics. He had been experiencing dizziness, increasing dyspnea with exertion, and exertional and nonexertional chest pain.  He feels well today, and denies any chest pain. His chronic dyspnea on exertion is stable. He typically uses 3-4 L of oxygen when he is exerting himself such as at the [facility], but otherwise uses 2 L chronically. He denies leg swelling.  Soc: Served in the [employer] on the [employer] in [place].   Review of Systems: As per "subjective", otherwise negative.  No Known Allergies  Current Outpatient Prescriptions  Medication Sig Dispense Refill  . alfuzosin (UROXATRAL) 10 MG 24 hr tablet Take 10 mg by mouth daily with breakfast.    . aspirin EC 81 MG tablet Take 81 mg by mouth daily.    Marland Kitchen atorvastatin (LIPITOR) 80 MG tablet Take 80 mg by mouth daily.    Marland Kitchen CALCIUM PO Take 1 tablet by mouth 3 (three) times daily.    . Cyanocobalamin (VITAMIN B-12 PO) Take 1 tablet by mouth daily.    Marland Kitchen levothyroxine (SYNTHROID, LEVOTHROID) 125 MCG tablet Take 125 mcg by mouth daily before breakfast.    . Multiple Vitamin (MULTIVITAMIN) capsule Take 1 capsule by mouth daily.    . mycophenolate (CELLCEPT) 500 MG tablet Take 500-1,000 mg by mouth 2 (two) times daily. *takes 1000mg  in the morning and 500mg  at night*    . NEXIUM 40 MG  capsule Take 40 mg by mouth daily.     . niacin (NIASPAN) 500 MG CR tablet Take 500 mg by mouth every morning.     . Omega-3 Fatty Acids (FISH OIL PO) Take 2 tablets by mouth daily.     . OXYGEN-HELIUM IN 2 L.    . predniSONE (DELTASONE) 10 MG tablet Take 10 mg by mouth every morning.     . sulfamethoxazole-trimethoprim (BACTRIM DS) 800-160 MG per tablet Take 1 tablet by mouth every Monday, Wednesday, and Friday.      No current facility-administered medications for this visit.    Past Medical History  Diagnosis Date  . AAA (abdominal aortic aneurysm)     Doppler (Dr Trena Platt office 07/2010...stable 3.2)  . Ejection fraction     Normal ejection fraction, echo, October 14, 2010,  Dr Manuella Ghazi office,, reports scan the disc E. MR  . Carotid artery disease     Doppler, Dr. Manuella Ghazi office, June, 2012, report scanned to this EMR, less than 50% bilateral  . Severe aortic stenosis 07/02/2010    Qualifier: Diagnosis of  By: Ron Parker, MD, [facility], [place]     Past Surgical History  Procedure Laterality Date  . Hernia repair Bilateral     bilateral inguinal hernia repair  . Partial turp  03/01/2014    DR. BAUER    History   Social History  . Marital Status: Married    Spouse Name: N/A    Number of Children: N/A  . Years of Education: N/A  Occupational History  . Not on file.   Social History Main Topics  . Smoking status: Former Smoker    Quit date: 12/09/1964  . Smokeless tobacco: Not on file  . Alcohol Use: No  . Drug Use: No  . Sexual Activity: Not on file   Other Topics Concern  . Not on file   Social History Narrative    BP 131/65  Pulse 70  SpO2 97% Weight 181 lb (82.101 kg) Height 5' 10.5" (1.791 m)   PHYSICAL EXAM General: NAD, using oxygen by nasal cannula  Neck: No JVD, no thyromegaly or thyroid nodule.  Lungs: Diffuse dry crackles, no wheezes.  CV: Nondisplaced PMI. Regular rate and rhythm, normal S1/S2, no S3/S4, IV/VI late-peaking systolic murmur best heard over  RUSB. No peripheral edema. No carotid bruit. Normal pedal pulses.  Abdomen: Soft, nontender, no hepatosplenomegaly, no distention.  Skin: Intact without lesions or rashes.  Neurologic: Alert and oriented x 3.  Psych: Normal affect.  Skin: Normal. Musculoskeletal: Normal range of motion, no gross deformities. Extremities: No clubbing or cyanosis.   ECG: Most recent ECG reviewed.      ASSESSMENT AND PLAN: 1. Aortic stenosis: Symptomatically stable. Likely moderate in severity. Will continue medical management for symptoms and continued observation with surveillance echocardiography. 2. CAD: Stable ischemic heart disease. Coronary angiogram results as noted above. Continue ASA and Lipitor. 3. AAA: Followed by PCP. 3.2 cm on 08/05/10.  Dispo: f/u 3-4 months.  Kate Sable, M.D., F.A.C.C.

## 2014-04-06 ENCOUNTER — Encounter (HOSPITAL_COMMUNITY): Payer: Self-pay | Admitting: Cardiovascular Disease

## 2014-07-03 ENCOUNTER — Encounter: Payer: Self-pay | Admitting: Cardiovascular Disease

## 2014-07-03 ENCOUNTER — Ambulatory Visit (INDEPENDENT_AMBULATORY_CARE_PROVIDER_SITE_OTHER): Payer: Medicare Other | Admitting: Cardiovascular Disease

## 2014-07-03 VITALS — BP 108/58 | HR 67 | Ht 70.0 in | Wt 185.0 lb

## 2014-07-03 DIAGNOSIS — J8410 Pulmonary fibrosis, unspecified: Secondary | ICD-10-CM | POA: Diagnosis not present

## 2014-07-03 DIAGNOSIS — I2510 Atherosclerotic heart disease of native coronary artery without angina pectoris: Secondary | ICD-10-CM | POA: Diagnosis not present

## 2014-07-03 DIAGNOSIS — I350 Nonrheumatic aortic (valve) stenosis: Secondary | ICD-10-CM

## 2014-07-03 NOTE — Progress Notes (Signed)
Patient ID: Rodney Bowman, male   DOB: 17-Nov-1921, 81 y.o.   MRN: [id number]      SUBJECTIVE: The patient presents for follow-up of coronary artery disease and aortic stenosis. He underwent left heart catheterization and coronary angiography on 12/15/2013. It demonstrated chronic subtotal occlusion of the right coronary artery with left-to-right collaterals, mild to moderate diffuse LAD and left circumflex coronary artery stenosis, and a heavily calcified aortic valve with hemodynamics consistent with mild aortic stenosis. Dr. Burt Knack, who performed the procedure, commented that he suspected the patient had moderate aortic stenosis, consistent with echocardiography hemodynamics. He had been experiencing dizziness, increasing dyspnea with exertion, and exertional and nonexertional chest pain.  He feels well today, and denies any chest pain. His chronic dyspnea on exertion is stable. He typically uses 3-4 L of oxygen when he is exerting himself such as at the [facility], but otherwise uses 2 L chronically. He denies leg swelling. He said he is very grateful for his medical care and feels glad to be alive.  Soc: Served in the [employer] on the [employer] in [place]. His wife is 43. They have 2 sons. One is a retired Theme park manager IT sales professional) in [place], [place]. Another son is retired from missions.   Review of Systems: As per "subjective", otherwise negative.  No Known Allergies  Current Outpatient Prescriptions  Medication Sig Dispense Refill  . alfuzosin (UROXATRAL) 10 MG 24 hr tablet Take 10 mg by mouth daily with breakfast.    . aspirin EC 81 MG tablet Take 81 mg by mouth daily.    Marland Kitchen atorvastatin (LIPITOR) 80 MG tablet Take 80 mg by mouth daily.    Marland Kitchen CALCIUM PO Take 1 tablet by mouth 3 (three) times daily.    . Cyanocobalamin (VITAMIN B-12 PO) Take 1 tablet by mouth daily.    Marland Kitchen levothyroxine (SYNTHROID, LEVOTHROID) 125 MCG tablet Take 125 mcg by mouth daily before breakfast.    . Multiple Vitamin (MULTIVITAMIN)  capsule Take 1 capsule by mouth daily.    . mycophenolate (CELLCEPT) 500 MG tablet Take 500-1,000 mg by mouth 2 (two) times daily. *takes 1000mg  in the morning and 500mg  at night*    . NEXIUM 40 MG capsule Take 40 mg by mouth daily.     . niacin (NIASPAN) 500 MG CR tablet Take 500 mg by mouth every morning.     . Omega-3 Fatty Acids (FISH OIL PO) Take 2 tablets by mouth daily.     . OXYGEN-HELIUM IN 2 L.    . predniSONE (DELTASONE) 10 MG tablet Take 10 mg by mouth every morning.     . sulfamethoxazole-trimethoprim (BACTRIM DS) 800-160 MG per tablet Take 1 tablet by mouth every Monday, Wednesday, and Friday.      No current facility-administered medications for this visit.    Past Medical History  Diagnosis Date  . AAA (abdominal aortic aneurysm)     Doppler (Dr Trena Platt office 07/2010...stable 3.2)  . Ejection fraction     Normal ejection fraction, echo, October 14, 2010,  Dr Manuella Ghazi office,, reports scan the disc E. MR  . Carotid artery disease     Doppler, Dr. Manuella Ghazi office, June, 2012, report scanned to this EMR, less than 50% bilateral  . Severe aortic stenosis 07/02/2010    Qualifier: Diagnosis of  By: Ron Parker, MD, [facility], [place]     Past Surgical History  Procedure Laterality Date  . Hernia repair Bilateral     bilateral inguinal hernia repair  . Partial turp  03/01/2014    DR. BAUER  . Left and right heart catheterization with coronary angiogram N/A 12/15/2013    Procedure: LEFT AND RIGHT HEART CATHETERIZATION WITH CORONARY ANGIOGRAM;  Surgeon: Blane Ohara, [place];  Location: [facility] CATH LAB;  Service: Cardiovascular;  Laterality: N/A;    History   Social History  . Marital Status: Married    Spouse Name: N/A  . Number of Children: N/A  . Years of Education: N/A   Occupational History  . Not on file.   Social History Main Topics  . Smoking status: Former Smoker    Quit date: 12/09/1964  . Smokeless tobacco: Never Used  . Alcohol Use: No  . Drug Use: No  . Sexual Activity:  Not on file   Other Topics Concern  . Not on file   Social History Narrative     Danley Danker Vitals:   07/03/14 0953  Height: 5\' 10"  (1.778 m)  Weight: 185 lb (83.915 kg)   BP 108/58  Pulse 67  SpO2 98%   PHYSICAL EXAM General: NAD, using oxygen by nasal cannula  Neck: No JVD, no thyromegaly or thyroid nodule.  Lungs: Diffuse dry crackles, no wheezes.  CV: Nondisplaced PMI. Regular rate and rhythm, normal S1/S2, no S3/S4, IV/VI late-peaking systolic murmur best heard over RUSB. No peripheral edema. No carotid bruit. Normal pedal pulses.  Abdomen: Soft, nontender, no hepatosplenomegaly, no distention.  Skin: Intact without lesions or rashes.  Neurologic: Alert and oriented x 3.  Psych: Normal affect.  Skin: Normal. Musculoskeletal: Normal range of motion, no gross deformities. Extremities: No clubbing or cyanosis.   ECG: Most recent ECG reviewed.      ASSESSMENT AND PLAN: 1. Aortic stenosis: Symptomatically stable. Likely moderate in severity. Will continue medical management for symptoms and continued observation with surveillance echocardiography. 2. CAD: Stable ischemic heart disease. Coronary angiogram results as noted above. Continue ASA and Lipitor as he is tolerating medications without side effects. 3. AAA: Followed by PCP. 3.2 cm on 08/05/10.  Dispo: f/u 6 months.   Kate Sable, M.D., F.A.C.C.

## 2014-07-03 NOTE — Patient Instructions (Signed)
Your physician wants you to follow-up in: 6 months with Dr. Koneswaran. You will receive a reminder letter in the mail two months in advance. If you don't receive a letter, please call our office to schedule the follow-up appointment.  Your physician recommends that you continue on your current medications as directed. Please refer to the Current Medication list given to you today.  Thank you for choosing [facility]!   

## 2014-08-18 ENCOUNTER — Encounter: Payer: Self-pay | Admitting: *Deleted

## 2014-08-18 ENCOUNTER — Telehealth: Payer: Self-pay | Admitting: Cardiovascular Disease

## 2014-08-18 NOTE — Telephone Encounter (Signed)
Patient walked into office to let us know that he was awoken from sleep with sharpe pain in his upper left arm that lasted about 30 minutes.  He stated that he had no other symptoms and that he is currently not experiencing any pain.   Wanted to speak with nurse to see if he should be concerned or limit his daily activites.

## 2014-08-18 NOTE — Telephone Encounter (Signed)
About 2-3 am - this morning woke up with left arm pain between upper shoulder & elbow muscle hurting.  [facility] and [facility] some things around in basement.  No c/o chest pain, dizziness, or SOB.  Advised to continue to monitor & if continued to bother - see PMD on Monday.  Patient verbalized understanding.

## 2014-10-23 ENCOUNTER — Other Ambulatory Visit: Payer: Self-pay

## 2014-12-21 ENCOUNTER — Encounter: Payer: Self-pay | Admitting: *Deleted

## 2014-12-21 ENCOUNTER — Ambulatory Visit (INDEPENDENT_AMBULATORY_CARE_PROVIDER_SITE_OTHER): Payer: Medicare Other | Admitting: Cardiovascular Disease

## 2014-12-21 VITALS — BP 119/69 | HR 63 | Ht 70.0 in | Wt 181.0 lb

## 2014-12-21 DIAGNOSIS — J8410 Pulmonary fibrosis, unspecified: Secondary | ICD-10-CM

## 2014-12-21 DIAGNOSIS — I2510 Atherosclerotic heart disease of native coronary artery without angina pectoris: Secondary | ICD-10-CM | POA: Diagnosis not present

## 2014-12-21 DIAGNOSIS — I714 Abdominal aortic aneurysm, without rupture, unspecified: Secondary | ICD-10-CM

## 2014-12-21 DIAGNOSIS — I350 Nonrheumatic aortic (valve) stenosis: Secondary | ICD-10-CM | POA: Diagnosis not present

## 2014-12-21 DIAGNOSIS — I080 Rheumatic disorders of both mitral and aortic valves: Secondary | ICD-10-CM

## 2014-12-21 MED ORDER — NITROGLYCERIN 0.4 MG SL SUBL: 0.4000 mg | SUBLINGUAL_TABLET | SUBLINGUAL | Status: DC | PRN

## 2014-12-21 NOTE — Patient Instructions (Addendum)
   Refill for Nitroglycerin sent to [place] today. Continue all other medications.   Your physician wants you to follow up in: 6 months.  You will receive a reminder letter in the mail one-two months in advance.  If you don't receive a letter, please call our office to schedule the follow up appointment

## 2014-12-21 NOTE — Progress Notes (Signed)
Patient ID: Rodney Bowman, male   DOB: [birth year], 81 y.o.   MRN: [id number]      SUBJECTIVE: The patient presents for follow-up of coronary artery disease and aortic stenosis. He underwent left heart catheterization and coronary angiography on 12/15/2013. It demonstrated chronic subtotal occlusion of the right coronary artery with left-to-right collaterals, mild to moderate diffuse LAD and left circumflex coronary artery stenosis, and a heavily calcified aortic valve with hemodynamics consistent with mild aortic stenosis. Dr. Burt Knack, who performed the procedure, commented that he suspected the patient had moderate aortic stenosis, consistent with echocardiography hemodynamics. He had been experiencing dizziness, increasing dyspnea with exertion, and exertional and nonexertional chest pain.  He feels well today. He experiences shooting chest pains once every 4-6 weeks lasting 30 seconds which spontaneously resolve. He has similar pains in other parts of his body including both arms. His chronic dyspnea on exertion is stable. He typically uses 3-4 L of oxygen when he is exerting himself such as at the [facility] (2-3 days per week), but otherwise uses 2 L chronically. He denies leg swelling, dizziness, and syncope.  ECG performed in the office today demonstrates sinus rhythm with sinus arrhythmia, heart rate 62 bpm, and possible old lateral infarct.   Soc: Served in the [place] [employer] on the [employer] in [place]. Married.. They have 2 sons. One is a retired Theme park manager IT sales professional) in [place], [place]. Another son is retired from missions.    Review of Systems: As per "subjective", otherwise negative.  No Known Allergies  Current Outpatient Prescriptions  Medication Sig Dispense Refill  . alfuzosin (UROXATRAL) 10 MG 24 hr tablet Take 10 mg by mouth daily with breakfast.    . aspirin EC 81 MG tablet Take 81 mg by mouth daily.    Marland Kitchen atorvastatin (LIPITOR) 80 MG tablet Take 80 mg by mouth daily.    Marland Kitchen CALCIUM PO Take 1  tablet by mouth 3 (three) times daily.    . Cyanocobalamin (VITAMIN B-12 PO) Take 1 tablet by mouth daily.    Marland Kitchen levothyroxine (SYNTHROID, LEVOTHROID) 125 MCG tablet Take 125 mcg by mouth daily before breakfast.    . Multiple Vitamin (MULTIVITAMIN) capsule Take 1 capsule by mouth daily.    . mycophenolate (CELLCEPT) 500 MG tablet Take 500-1,000 mg by mouth 2 (two) times daily. *takes 1000mg  in the morning and 500mg  at night*    . NEXIUM 40 MG capsule Take 40 mg by mouth daily.     . niacin (NIASPAN) 500 MG CR tablet Take 500 mg by mouth every morning.     . Omega-3 Fatty Acids (FISH OIL PO) Take 2 tablets by mouth daily.     . OXYGEN-HELIUM IN 2 L.    . predniSONE (DELTASONE) 10 MG tablet Take 10 mg by mouth every morning.     . sulfamethoxazole-trimethoprim (BACTRIM DS) 800-160 MG per tablet Take 1 tablet by mouth every Monday, Wednesday, and Friday.      No current facility-administered medications for this visit.    Past Medical History  Diagnosis Date  . AAA (abdominal aortic aneurysm)     Doppler (Dr Trena Platt office 07/2010...stable 3.2)  . Ejection fraction     Normal ejection fraction, echo, October 14, 2010,  Dr Manuella Ghazi office,, reports scan the disc E. MR  . Carotid artery disease     Doppler, Dr. Manuella Ghazi office, June, 2012, report scanned to this EMR, less than 50% bilateral  . Severe aortic stenosis 07/02/2010    Qualifier: Diagnosis of  ByRon Parker, MD, [facility], [place]     Past Surgical History  Procedure Laterality Date  . Hernia repair Bilateral     bilateral inguinal hernia repair  . Partial turp  03/01/2014    DR. BAUER  . Left and right heart catheterization with coronary angiogram N/A 12/15/2013    Procedure: LEFT AND RIGHT HEART CATHETERIZATION WITH CORONARY ANGIOGRAM;  Surgeon: Blane Ohara, [place];  Location: [facility] CATH LAB;  Service: Cardiovascular;  Laterality: N/A;    Social History   Social History  . Marital Status: Married    Spouse Name: N/A  . Number of Children:  N/A  . Years of Education: N/A   Occupational History  . Not on file.   Social History Main Topics  . Smoking status: Former Smoker -- 1.00 packs/day for 25 years    Types: Cigarettes    Start date: 02/18/1940    Quit date: 12/09/1964  . Smokeless tobacco: Never Used  . Alcohol Use: No  . Drug Use: No  . Sexual Activity: Not on file   Other Topics Concern  . Not on file   Social History Narrative     Filed Vitals:   12/21/14 1123  BP: 119/69  Pulse: 63  Height: 5\' 10"  (1.778 m)  Weight: 181 lb (82.101 kg)    PHYSICAL EXAM General: NAD, using oxygen by nasal cannula  Neck: No JVD, no thyromegaly or thyroid nodule.  Lungs: Diffuse dry crackles, no wheezes.  CV: Nondisplaced PMI. Regular rate and rhythm, normal S1/S2, no S3/S4, IV/VI late-peaking systolic murmur best heard over RUSB. No peripheral edema. Abdomen: Soft, no distention.  Skin: Intact without lesions or rashes.  Neurologic: Alert and oriented x 3.  Psych: Normal affect.  Skin: Normal. Musculoskeletal: No gross deformities. Extremities: No clubbing or cyanosis.   ECG: Most recent ECG reviewed.      ASSESSMENT AND PLAN: 1. Aortic stenosis: Symptomatically stable. Likely moderate in severity. Will continue medical management for symptoms and continued observation with surveillance echocardiography, with consideration for repeat study in 6 months at time of next visit.  2. CAD: Stable ischemic heart disease. Coronary angiogram results as noted above. Continue ASA and Lipitor as he is tolerating medications without side effects. Will give prescription for SL nitroglycerin.  3. AAA: Followed by PCP. 3.2 cm on 08/11/11.  Dispo: f/u 6 months.   Kate Sable, M.D., F.A.C.C.

## 2015-05-11 DIAGNOSIS — B079 Viral wart, unspecified: Secondary | ICD-10-CM | POA: Diagnosis not present

## 2015-05-11 DIAGNOSIS — N400 Enlarged prostate without lower urinary tract symptoms: Secondary | ICD-10-CM | POA: Diagnosis not present

## 2015-05-22 DIAGNOSIS — Z6827 Body mass index (BMI) 27.0-27.9, adult: Secondary | ICD-10-CM | POA: Diagnosis not present

## 2015-05-22 DIAGNOSIS — J849 Interstitial pulmonary disease, unspecified: Secondary | ICD-10-CM | POA: Diagnosis not present

## 2015-05-22 DIAGNOSIS — Z87891 Personal history of nicotine dependence: Secondary | ICD-10-CM | POA: Diagnosis not present

## 2015-05-22 DIAGNOSIS — K529 Noninfective gastroenteritis and colitis, unspecified: Secondary | ICD-10-CM | POA: Diagnosis not present

## 2015-05-23 DIAGNOSIS — K529 Noninfective gastroenteritis and colitis, unspecified: Secondary | ICD-10-CM | POA: Diagnosis not present

## 2015-06-07 ENCOUNTER — Encounter: Payer: Self-pay | Admitting: Internal Medicine

## 2015-06-22 DIAGNOSIS — E7800 Pure hypercholesterolemia, unspecified: Secondary | ICD-10-CM | POA: Diagnosis not present

## 2015-06-22 DIAGNOSIS — I259 Chronic ischemic heart disease, unspecified: Secondary | ICD-10-CM | POA: Diagnosis not present

## 2015-06-22 DIAGNOSIS — J449 Chronic obstructive pulmonary disease, unspecified: Secondary | ICD-10-CM | POA: Diagnosis not present

## 2015-06-22 DIAGNOSIS — N400 Enlarged prostate without lower urinary tract symptoms: Secondary | ICD-10-CM | POA: Diagnosis not present

## 2015-06-25 ENCOUNTER — Ambulatory Visit: Payer: Medicare Other | Admitting: Gastroenterology

## 2015-06-28 ENCOUNTER — Telehealth: Payer: Self-pay

## 2015-06-28 ENCOUNTER — Encounter: Payer: Self-pay | Admitting: Gastroenterology

## 2015-06-28 ENCOUNTER — Ambulatory Visit (INDEPENDENT_AMBULATORY_CARE_PROVIDER_SITE_OTHER): Payer: Medicare Other | Admitting: Gastroenterology

## 2015-06-28 VITALS — BP 116/45 | HR 74 | Temp 97.5°F | Ht 70.0 in | Wt 172.8 lb

## 2015-06-28 DIAGNOSIS — K529 Noninfective gastroenteritis and colitis, unspecified: Secondary | ICD-10-CM | POA: Diagnosis not present

## 2015-06-28 NOTE — Assessment & Plan Note (Signed)
82 year old gentleman who presents with greater than 6 week history of diarrhea. Remote colonoscopy. He is not sure of any specific medication changes other than he believes he was switched from Nexium to Zegerid. He did stool studies, we have requested those records. Please he was improved on Flagyl but this has since been completed.  I had requested records from Dr. Manuella Ghazi including any labs and stool studies. We have requested updated medication list from both [facility] and [facility]. Once I reviewed those records, we will make further recommendations. I did discuss with the patient and his wife today that I am apprehensive about considering colonoscopy with sedation in the setting of severe interstitial lung disease. Further recommendations to follow.

## 2015-06-28 NOTE — Progress Notes (Signed)
Medications list updated per pharmacy information.

## 2015-06-28 NOTE — Progress Notes (Signed)
CC'ED TO PCP 

## 2015-06-28 NOTE — Progress Notes (Addendum)
Primary Care Physician:  Monico Blitz, MD  Primary Gastroenterologist:  Garfield Cornea, MD   Chief Complaint  Patient presents with  . Diarrhea  . set up TCS    HPI:  Rodney Bowman is a 82 y.o. male here at the request of Dr. Brigitte Pulse for further evaluation of chronic diarrhea and question need for colonoscopy. Patient reportedly submitted stool specimens, he has not heard results. We have requested those records. Patient presents with a medication list and does not appear to be quite confident that he is taking all these medications. We have requested records from both of his local pharmacies where he tells me he receives all of his medications from, [facility] and [facility].  He describes a 5-6 week period of diarrhea. Symptoms seem to check up for about 10-14 days, he believes while he was on Flagyl. Stools have subsequently returned to be in loose to watery. He has 3 stools daily. In the beginning he was having 4-5. Stools are dark. Associated with some mucus. Chronically has a poor appetite. Denies heartburn or indigestion. Denies abdominal pain. He is unsure of any recent medication changes although he did switch from Nexium to Zegerid he believes. We are trying to confirm this. Had been on nexium for 20 years. Question of melena although he tells me it is difficult for him to see. No dysphagia.  Current Outpatient Prescriptions  Medication Sig Dispense Refill  . alfuzosin (UROXATRAL) 10 MG 24 hr tablet Take 10 mg by mouth daily with breakfast.    . aspirin EC 81 MG tablet Take 81 mg by mouth daily.    Marland Kitchen atorvastatin (LIPITOR) 80 MG tablet Take 80 mg by mouth daily.    . fluticasone furoate-vilanterol (BREO ELLIPTA) 100-25 MCG/INH AEPB Inhale 1 puff into the lungs daily.    . Fluticasone Propionate (FLONASE ALLERGY RELIEF NA) Place into the nose 2 (two) times daily.    Marland Kitchen levothyroxine (SYNTHROID, LEVOTHROID) 125 MCG tablet Take 125 mcg by mouth daily before breakfast.    . Multiple Vitamin  (MULTIVITAMIN) capsule Take 1 capsule by mouth daily.    . mycophenolate (CELLCEPT) 500 MG tablet Take 500-1,000 mg by mouth 2 (two) times daily. *takes 1000mg  in the morning and 500mg  at night*    . niacin (NIASPAN) 500 MG CR tablet Take 500 mg by mouth 2 (two) times daily.     . Omega-3 Fatty Acids (FISH OIL PO) Take 2 tablets by mouth daily.     Marland Kitchen omeprazole-sodium bicarbonate (ZEGERID) 40-1100 MG capsule Take 1 capsule by mouth daily before breakfast.    . OXYGEN-HELIUM IN 2 L.    . predniSONE (DELTASONE) 10 MG tablet Take 10 mg by mouth every morning.     . risedronate (ACTONEL) 35 MG tablet Take 35 mg by mouth every 7 (seven) days. with water on empty stomach, nothing by mouth or lie down for next 30 minutes.    . sulfamethoxazole-trimethoprim (BACTRIM DS) 800-160 MG per tablet Take 1 tablet by mouth every Monday, Wednesday, and Friday.     . esomeprazole (NEXIUM) 40 MG capsule Take 40 mg by mouth daily at 12 noon.     No current facility-administered medications for this visit.    Allergies as of 06/28/2015  . (No Known Allergies)    Past Medical History  Diagnosis Date  . AAA (abdominal aortic aneurysm) ([place])     Doppler (Dr Trena Platt office 07/2010...stable 3.2)  . Ejection fraction     Normal ejection fraction, echo,  October 14, 2010,  Dr Manuella Ghazi office,, reports scan the disc E. MR  . Carotid artery disease ([place])     Doppler, Dr. Manuella Ghazi office, June, 2012, report scanned to this EMR, less than 50% bilateral  . Severe aortic stenosis 07/02/2010    Qualifier: Diagnosis of  By: Ron Parker, MD, [facility] [place]   . COPD (chronic obstructive pulmonary disease) ([place])   . Hypothyroidism   . Hyperlipidemia   . Interstitial lung disease ([place])     follow[facility] at [facility]    Past Surgical History  Procedure Laterality Date  . Hernia repair Bilateral     bilateral inguinal hernia repair  . Partial turp  03/01/2014    DR. BAUER  . Left and right heart catheterization with coronary angiogram N/A  12/15/2013    Procedure: LEFT AND RIGHT HEART CATHETERIZATION WITH CORONARY ANGIOGRAM;  Surgeon: Blane Ohara, MD;  Location: [facility] CATH LAB;  Service: Cardiovascular;  Laterality: N/A;  . Colonoscopy  1999    Dr. Lindalou Hose, reportedly had anal stenosis   . Colonoscopy  2010    ?normal per PCP records    Family History  Problem Relation Age of Onset  . Heart attack Father   . Pancreatic cancer Mother   . Cancer Brother   . Colon cancer Neg Hx     Social History   Social History  . Marital Status: Married    Spouse Name: N/A  . Number of Children: N/A  . Years of Education: N/A   Occupational History  . Not on file.   Social History Main Topics  . Smoking status: Former Smoker -- 1.00 packs/day for 25 years    Types: Cigarettes    Start date: 02/18/1940    Quit date: 12/09/1964  . Smokeless tobacco: Never Used  . Alcohol Use: No  . Drug Use: No  . Sexual Activity: Not on file   Other Topics Concern  . Not on file   Social History Narrative      ROS:  General: Negative forweight loss, fever, chills, fatigue. See history of present illness. Eyes: Negative for vision changes.  ENT: Negative for hoarseness, difficulty swallowing , nasal congestion. CV: Negative for chest pain, angina, palpitations. Positive DOE and peripheral edema.  Respiratory: Negative for cough, sputum, wheezing. Positive dyspnea at rest and exertion. 4 L of oxygen most of the time. GI: See history of present illness. GU:  Negative for dysuria, hematuria, urinary incontinence, urinary frequency, positive nocturnal urination.  MS: Negative for joint pain, low back pain.  Derm: Negative for rash or itching.  Neuro: Negative for weakness, abnormal sensation, seizure, frequent headaches, memory loss, confusion.  Psych: Negative for anxiety, depression, suicidal ideation, hallucinations.  Endo: Negative for unusual weight change.  Heme: Negative for bruising or bleeding. Allergy: Negative for rash  or hives.    Physical Examination:  BP 116/45 mmHg  Pulse 74  Temp(Src) 97.5 F (36.4 C)  Ht 5\' 10"  (1.778 m)  Wt 172 lb 12.8 oz (78.382 kg)  BMI 24.79 kg/m2   General: Elderly Caucasian gentleman in no acute distress. Accompanied by his wife of 24 years. He does have to pause in between sentences to breathe.  Head: Normocephalic, atraumatic.   Eyes: Conjunctiva pink, no icterus. Mouth: Oropharyngeal mucosa moist and pink , no lesions erythema or exudate. Nasal cannula with 4L of oxygen Neck: Supple without thyromegaly, masses, or lymphadenopathy.  Lungs: diminished breath sounds throughout. Bilateral crackles in bases Heart: Regular rate and rhythm, no  murmurs rubs or gallops.  Abdomen: Bowel sounds are normal, nontender, nondistended, no hepatosplenomegaly or masses, no abdominal bruits or    hernia , no rebound or guarding.   Rectal: not performed Extremities: 1+ lower extremity edema. No clubbing or deformities.  Neuro: Alert and oriented x 4 , grossly normal neurologically.  Skin: Warm and dry, no rash or jaundice.   Psych: Alert and cooperative, normal mood and affect.  Labs: requested  Imaging Studies: No results found.

## 2015-06-28 NOTE — Addendum Note (Signed)
Addended by: Mahala Menghini on: 06/28/2015 02:59 PM   Modules accepted: Orders, Medications

## 2015-06-28 NOTE — Patient Instructions (Signed)
1. We will request old records from Dr. Manuella Ghazi and current medication list from both [facility] and [facility]. Once I reviewed these records we will call you with further instructions.

## 2015-07-09 DIAGNOSIS — K529 Noninfective gastroenteritis and colitis, unspecified: Secondary | ICD-10-CM | POA: Diagnosis not present

## 2015-07-09 NOTE — Progress Notes (Signed)
Records reviewed from 04/2015. Stool culture, fecal wbc, cdiff toxin A+B all negative.   TCS 03/2009 by Dr. Lindalou Hose: normal.   Can we find out how the patient is doing? If he is still having diarrhea, I would consider stopping Zegerid for two weeks to see if that is causing it.

## 2015-07-12 DIAGNOSIS — J84111 Idiopathic interstitial pneumonia, not otherwise specified: Secondary | ICD-10-CM | POA: Diagnosis not present

## 2015-07-12 DIAGNOSIS — R0600 Dyspnea, unspecified: Secondary | ICD-10-CM | POA: Diagnosis not present

## 2015-07-12 DIAGNOSIS — R0689 Other abnormalities of breathing: Secondary | ICD-10-CM | POA: Diagnosis not present

## 2015-07-12 DIAGNOSIS — J431 Panlobular emphysema: Secondary | ICD-10-CM | POA: Diagnosis not present

## 2015-07-12 DIAGNOSIS — M359 Systemic involvement of connective tissue, unspecified: Secondary | ICD-10-CM | POA: Diagnosis not present

## 2015-07-12 DIAGNOSIS — Z79899 Other long term (current) drug therapy: Secondary | ICD-10-CM | POA: Diagnosis not present

## 2015-07-13 NOTE — Progress Notes (Signed)
Spoke with the pt, he is not having diarrhea any more. He is seeing his MD's at [facility] about his heart and other non-GI concerns.

## 2015-07-17 ENCOUNTER — Ambulatory Visit (INDEPENDENT_AMBULATORY_CARE_PROVIDER_SITE_OTHER): Payer: Medicare Other | Admitting: Cardiovascular Disease

## 2015-07-17 ENCOUNTER — Encounter: Payer: Self-pay | Admitting: Cardiovascular Disease

## 2015-07-17 VITALS — BP 94/48 | HR 80 | Ht 70.0 in | Wt 171.0 lb

## 2015-07-17 DIAGNOSIS — I714 Abdominal aortic aneurysm, without rupture, unspecified: Secondary | ICD-10-CM

## 2015-07-17 DIAGNOSIS — I25768 Atherosclerosis of bypass graft of coronary artery of transplanted heart with other forms of angina pectoris: Secondary | ICD-10-CM | POA: Diagnosis not present

## 2015-07-17 DIAGNOSIS — I350 Nonrheumatic aortic (valve) stenosis: Secondary | ICD-10-CM | POA: Diagnosis not present

## 2015-07-17 DIAGNOSIS — R0609 Other forms of dyspnea: Secondary | ICD-10-CM

## 2015-07-17 DIAGNOSIS — R0602 Shortness of breath: Secondary | ICD-10-CM

## 2015-07-17 NOTE — Patient Instructions (Signed)
Your physician has requested that you have an echocardiogram. Echocardiography is a painless test that uses sound waves to create images of your heart. It provides your doctor with information about the size and shape of your heart and how well your heart's chambers and valves are working. This procedure takes approximately one hour. There are no restrictions for this procedure. Office will contact with results via phone or letter.   Continue all current medications. Follow up in  1 month  

## 2015-07-17 NOTE — Addendum Note (Signed)
Addended by: Laurine Blazer on: 07/17/2015 02:57 PM   Modules accepted: Orders

## 2015-07-17 NOTE — Progress Notes (Signed)
Patient ID: Rodney Bowman, male   DOB: [birth year], 82 y.o.   MRN: [id number]      SUBJECTIVE: The patient presents for follow-up of coronary artery disease and aortic stenosis. He underwent left heart catheterization and coronary angiography on 12/15/2013. It demonstrated chronic subtotal occlusion of the right coronary artery with left-to-right collaterals, mild to moderate diffuse LAD and left circumflex coronary artery stenosis, and a heavily calcified aortic valve with hemodynamics consistent with mild aortic stenosis. Dr. Burt Knack, who performed the procedure, commented that he suspected the patient had moderate aortic stenosis, consistent with echocardiography hemodynamics. He had been experiencing dizziness, increasing dyspnea with exertion, and exertional and nonexertional chest pain.  Today he complains of worsening shortness of breath over the past few months and is now using 4 L of continuous oxygen by nasal cannula and sometimes 6 L. He has occasional shooting left precordial pains which required one sublingual nitroglycerin tablet approximately one week ago. He denies syncope.  Last ECG in 2016 showed sinus rhythm with marked sinus arrhythmia.  Soc: Served in the [place] [employer] on [employer] in [place]. Married.. They have 2 sons. One is a retired Theme park manager IT sales professional) in [place], [place]. Another son is retired from missions.    Review of Systems: As per "subjective", otherwise negative.  No Known Allergies  Current Outpatient Prescriptions  Medication Sig Dispense Refill  . alfuzosin (UROXATRAL) 10 MG 24 hr tablet Take 10 mg by mouth daily with breakfast.    . aspirin EC 81 MG tablet Take 81 mg by mouth daily.    Marland Kitchen atorvastatin (LIPITOR) 80 MG tablet Take 80 mg by mouth daily.    Marland Kitchen esomeprazole (NEXIUM) 40 MG capsule Take 40 mg by mouth daily at 12 noon.    . fluticasone furoate-vilanterol (BREO ELLIPTA) 100-25 MCG/INH AEPB Inhale 1 puff into the lungs daily.    . Fluticasone Propionate  (FLONASE ALLERGY RELIEF NA) Place into the nose 2 (two) times daily.    Marland Kitchen levothyroxine (SYNTHROID, LEVOTHROID) 125 MCG tablet Take 125 mcg by mouth daily before breakfast.    . Multiple Vitamin (MULTIVITAMIN) capsule Take 1 capsule by mouth daily.    . mycophenolate (CELLCEPT) 500 MG tablet Take 500-1,000 mg by mouth 2 (two) times daily. *takes 1000mg  in the morning and 500mg  at night*    . niacin (NIASPAN) 500 MG CR tablet Take 500 mg by mouth 2 (two) times daily.     . Omega-3 Fatty Acids (FISH OIL PO) Take 2 tablets by mouth daily.     Marland Kitchen omeprazole-sodium bicarbonate (ZEGERID) 40-1100 MG capsule Take 1 capsule by mouth daily before breakfast.    . OXYGEN-HELIUM IN 2 L.    . predniSONE (DELTASONE) 20 MG tablet Take 20 mg by mouth daily with breakfast.    . risedronate (ACTONEL) 35 MG tablet Take 35 mg by mouth every 7 (seven) days. with water on empty stomach, nothing by mouth or lie down for next 30 minutes.     No current facility-administered medications for this visit.    Past Medical History  Diagnosis Date  . AAA (abdominal aortic aneurysm) ([place])     Doppler (Dr Trena Platt office 07/2010...stable 3.2)  . Ejection fraction     Normal ejection fraction, echo, October 14, 2010,  Dr Manuella Ghazi office,, reports scan the disc E. MR  . Carotid artery disease ([place])     Doppler, Dr. Manuella Ghazi office, June, 2012, report scanned to this EMR, less than 50% bilateral  . Severe aortic  stenosis 07/02/2010    Qualifier: Diagnosis of  By: Ron Parker, MD, Leonidas Romberg Dorinda Hill   . COPD (chronic obstructive pulmonary disease) ([place])   . Hypothyroidism   . Hyperlipidemia   . Interstitial lung disease ([place])     followed at [facility]  . Connective tissue disease ([place])     followed at [facility] interstitial lung clinic    Past Surgical History  Procedure Laterality Date  . Hernia repair Bilateral     bilateral inguinal hernia repair  . Partial turp  03/01/2014    DR. BAUER  . Left and right heart catheterization with coronary  angiogram N/A 12/15/2013    Procedure: LEFT AND RIGHT HEART CATHETERIZATION WITH CORONARY ANGIOGRAM;  Surgeon: Blane Ohara, MD;  Location: [facility] CATH LAB;  Service: Cardiovascular;  Laterality: N/A;  . Colonoscopy  1999    Dr. Lindalou Hose, reportedly had anal stenosis   . Colonoscopy  2010    ?normal per PCP records    Social History   Social History  . Marital Status: Married    Spouse Name: N/A  . Number of Children: N/A  . Years of Education: N/A   Occupational History  . Not on file.   Social History Main Topics  . Smoking status: Former Smoker -- 1.00 packs/day for 25 years    Types: Cigarettes    Start date: 02/18/1940    Quit date: 12/09/1964  . Smokeless tobacco: Never Used  . Alcohol Use: No  . Drug Use: No  . Sexual Activity: Not on file   Other Topics Concern  . Not on file   Social History Narrative     Filed Vitals:   07/17/15 1431  BP: 94/48  Pulse: 80  Height: 5\' 10"  (1.778 m)  Weight: 171 lb (77.565 kg)  SpO2: 90%    PHYSICAL EXAM General: NAD, using oxygen by nasal cannula  Neck: No JVD, no thyromegaly or thyroid nodule.  Lungs: Diffuse dry crackles, no wheezes.  CV: Nondisplaced PMI. Regular rate and irregular rhythm, normal S1/S2, no S3/S4, IV/VI late-peaking systolic murmur best heard over RUSB. No peripheral edema. Abdomen: Soft, no distention.  Skin: Intact without lesions or rashes.  Neurologic: Alert and oriented x 3.  Psych: Normal affect.  Skin: Normal. Musculoskeletal: No gross deformities.  ECG: Most recent ECG reviewed.      ASSESSMENT AND PLAN: 1. Increasing exertional dyspnea in context of aortic stenosis: Will obtain echo to assess for interval progression.  2. CAD: Stable ischemic heart disease. Coronary angiogram results as noted above. Continue ASA and Lipitor as he is tolerating medications without side effects.  3. AAA: Followed by PCP. 3.2 cm on 08/11/11.  Dispo: f/u 1 month.  Kate Sable, M.D.,  F.A.C.C.

## 2015-07-18 ENCOUNTER — Telehealth: Payer: Self-pay | Admitting: *Deleted

## 2015-07-18 ENCOUNTER — Other Ambulatory Visit: Payer: Self-pay

## 2015-07-18 ENCOUNTER — Ambulatory Visit (INDEPENDENT_AMBULATORY_CARE_PROVIDER_SITE_OTHER): Payer: Medicare Other

## 2015-07-18 DIAGNOSIS — R0602 Shortness of breath: Secondary | ICD-10-CM

## 2015-07-18 DIAGNOSIS — I350 Nonrheumatic aortic (valve) stenosis: Secondary | ICD-10-CM | POA: Diagnosis not present

## 2015-07-18 NOTE — Telephone Encounter (Signed)
Patient in office today for Echo.  BP prior to test was 84/46  HR 64-78.  [place] reported this to nurse as patient stated that he just felt so bad.  Review of chart did show low BP of 94/48  80 at OV yesterday.  Follow up OV scheduled for 08/20/2015.  Message sent to provider for further advice.

## 2015-07-19 ENCOUNTER — Telehealth: Payer: Self-pay | Admitting: *Deleted

## 2015-07-19 NOTE — Telephone Encounter (Signed)
Wife Luellen Pucker) notified & verbalize understanding.

## 2015-07-19 NOTE — Telephone Encounter (Signed)
-----   Message from Rodney Raisin, RN sent at 07/19/2015 10:31 AM EDT ----- Rodney Bowman Pt ----- Message -----    From: Rodney Commons, MD    Sent: 07/19/2015   8:56 AM      To: Rodney Raisin, RN  Normal pumping function with moderate calcium buildup on aortic valve. No indication for valve replacement at this time.

## 2015-07-19 NOTE — Telephone Encounter (Signed)
Notes Recorded by Laurine Blazer, LPN on [id number] at [id number] PM Wife Luellen Pucker) notified. Copy to pmd. Follow up scheduled 08/20/2015.

## 2015-07-19 NOTE — Telephone Encounter (Signed)
Not on antihypertensive therapy or diuretics. Would recommend increasing fluid intake and seeing PCP. Aortic stenosis remains moderate, so this is not the cause of low BP. May need IV fluids.

## 2015-07-30 DIAGNOSIS — J849 Interstitial pulmonary disease, unspecified: Secondary | ICD-10-CM | POA: Diagnosis not present

## 2015-07-30 DIAGNOSIS — N400 Enlarged prostate without lower urinary tract symptoms: Secondary | ICD-10-CM | POA: Diagnosis not present

## 2015-07-30 DIAGNOSIS — Z87891 Personal history of nicotine dependence: Secondary | ICD-10-CM | POA: Diagnosis not present

## 2015-07-30 DIAGNOSIS — I259 Chronic ischemic heart disease, unspecified: Secondary | ICD-10-CM | POA: Diagnosis not present

## 2015-08-02 NOTE — Progress Notes (Signed)
Reviewed

## 2015-08-03 DIAGNOSIS — I2510 Atherosclerotic heart disease of native coronary artery without angina pectoris: Secondary | ICD-10-CM | POA: Diagnosis not present

## 2015-08-03 DIAGNOSIS — Z7982 Long term (current) use of aspirin: Secondary | ICD-10-CM | POA: Diagnosis not present

## 2015-08-03 DIAGNOSIS — S199XXA Unspecified injury of neck, initial encounter: Secondary | ICD-10-CM | POA: Diagnosis not present

## 2015-08-03 DIAGNOSIS — S61213A Laceration without foreign body of left middle finger without damage to nail, initial encounter: Secondary | ICD-10-CM | POA: Diagnosis not present

## 2015-08-03 DIAGNOSIS — Z79899 Other long term (current) drug therapy: Secondary | ICD-10-CM | POA: Diagnosis not present

## 2015-08-03 DIAGNOSIS — N400 Enlarged prostate without lower urinary tract symptoms: Secondary | ICD-10-CM | POA: Diagnosis not present

## 2015-08-03 DIAGNOSIS — Z7952 Long term (current) use of systemic steroids: Secondary | ICD-10-CM | POA: Diagnosis not present

## 2015-08-03 DIAGNOSIS — J449 Chronic obstructive pulmonary disease, unspecified: Secondary | ICD-10-CM | POA: Diagnosis not present

## 2015-08-03 DIAGNOSIS — Z743 Need for continuous supervision: Secondary | ICD-10-CM | POA: Diagnosis not present

## 2015-08-03 DIAGNOSIS — S065X0A Traumatic subdural hemorrhage without loss of consciousness, initial encounter: Secondary | ICD-10-CM | POA: Diagnosis not present

## 2015-08-03 DIAGNOSIS — S0993XA Unspecified injury of face, initial encounter: Secondary | ICD-10-CM | POA: Diagnosis not present

## 2015-08-03 DIAGNOSIS — E039 Hypothyroidism, unspecified: Secondary | ICD-10-CM | POA: Diagnosis not present

## 2015-08-03 DIAGNOSIS — Z955 Presence of coronary angioplasty implant and graft: Secondary | ICD-10-CM | POA: Diagnosis not present

## 2015-08-03 DIAGNOSIS — S0180XA Unspecified open wound of other part of head, initial encounter: Secondary | ICD-10-CM | POA: Diagnosis not present

## 2015-08-03 DIAGNOSIS — S0181XA Laceration without foreign body of other part of head, initial encounter: Secondary | ICD-10-CM | POA: Diagnosis not present

## 2015-08-03 DIAGNOSIS — S61209A Unspecified open wound of unspecified finger without damage to nail, initial encounter: Secondary | ICD-10-CM | POA: Diagnosis not present

## 2015-08-03 DIAGNOSIS — S61011A Laceration without foreign body of right thumb without damage to nail, initial encounter: Secondary | ICD-10-CM | POA: Diagnosis not present

## 2015-08-03 DIAGNOSIS — Z9981 Dependence on supplemental oxygen: Secondary | ICD-10-CM | POA: Diagnosis not present

## 2015-08-03 DIAGNOSIS — S098XXA Other specified injuries of head, initial encounter: Secondary | ICD-10-CM | POA: Diagnosis not present

## 2015-08-03 DIAGNOSIS — E785 Hyperlipidemia, unspecified: Secondary | ICD-10-CM | POA: Diagnosis not present

## 2015-08-03 DIAGNOSIS — K219 Gastro-esophageal reflux disease without esophagitis: Secondary | ICD-10-CM | POA: Diagnosis not present

## 2015-08-03 DIAGNOSIS — W010XXA Fall on same level from slipping, tripping and stumbling without subsequent striking against object, initial encounter: Secondary | ICD-10-CM | POA: Diagnosis not present

## 2015-08-03 DIAGNOSIS — S0990XA Unspecified injury of head, initial encounter: Secondary | ICD-10-CM | POA: Diagnosis not present

## 2015-08-03 DIAGNOSIS — S61313A Laceration without foreign body of left middle finger with damage to nail, initial encounter: Secondary | ICD-10-CM | POA: Diagnosis not present

## 2015-08-03 DIAGNOSIS — Z87891 Personal history of nicotine dependence: Secondary | ICD-10-CM | POA: Diagnosis not present

## 2015-08-04 DIAGNOSIS — S065X0A Traumatic subdural hemorrhage without loss of consciousness, initial encounter: Secondary | ICD-10-CM | POA: Diagnosis not present

## 2015-08-04 DIAGNOSIS — S0990XA Unspecified injury of head, initial encounter: Secondary | ICD-10-CM | POA: Diagnosis not present

## 2015-08-09 DIAGNOSIS — J849 Interstitial pulmonary disease, unspecified: Secondary | ICD-10-CM | POA: Diagnosis not present

## 2015-08-09 DIAGNOSIS — T148 Other injury of unspecified body region: Secondary | ICD-10-CM | POA: Diagnosis not present

## 2015-08-09 DIAGNOSIS — Z299 Encounter for prophylactic measures, unspecified: Secondary | ICD-10-CM | POA: Diagnosis not present

## 2015-08-09 DIAGNOSIS — J449 Chronic obstructive pulmonary disease, unspecified: Secondary | ICD-10-CM | POA: Diagnosis not present

## 2015-08-16 DIAGNOSIS — S0181XD Laceration without foreign body of other part of head, subsequent encounter: Secondary | ICD-10-CM | POA: Diagnosis not present

## 2015-08-16 DIAGNOSIS — E7800 Pure hypercholesterolemia, unspecified: Secondary | ICD-10-CM | POA: Diagnosis not present

## 2015-08-16 DIAGNOSIS — G629 Polyneuropathy, unspecified: Secondary | ICD-10-CM | POA: Diagnosis not present

## 2015-08-16 DIAGNOSIS — J449 Chronic obstructive pulmonary disease, unspecified: Secondary | ICD-10-CM | POA: Diagnosis not present

## 2015-08-20 ENCOUNTER — Ambulatory Visit (INDEPENDENT_AMBULATORY_CARE_PROVIDER_SITE_OTHER): Payer: Medicare Other | Admitting: Cardiovascular Disease

## 2015-08-20 ENCOUNTER — Encounter: Payer: Self-pay | Admitting: Cardiovascular Disease

## 2015-08-20 VITALS — BP 111/61 | HR 58 | Ht 70.0 in | Wt 175.0 lb

## 2015-08-20 DIAGNOSIS — I350 Nonrheumatic aortic (valve) stenosis: Secondary | ICD-10-CM

## 2015-08-20 DIAGNOSIS — I25119 Atherosclerotic heart disease of native coronary artery with unspecified angina pectoris: Secondary | ICD-10-CM | POA: Diagnosis not present

## 2015-08-20 DIAGNOSIS — R0609 Other forms of dyspnea: Secondary | ICD-10-CM

## 2015-08-20 DIAGNOSIS — I714 Abdominal aortic aneurysm, without rupture, unspecified: Secondary | ICD-10-CM

## 2015-08-20 DIAGNOSIS — J8410 Pulmonary fibrosis, unspecified: Secondary | ICD-10-CM

## 2015-08-20 DIAGNOSIS — R079 Chest pain, unspecified: Secondary | ICD-10-CM

## 2015-08-20 NOTE — Progress Notes (Signed)
Patient ID: Rodney Bowman, male   DOB: [birth year], 82 y.o.   MRN: [id number]      SUBJECTIVE: The patient returns for follow-up after undergoing cardiovascular testing performed for the evaluation of worsening shortness of breath.  Echocardiogram showed vigorous left ventricular systolic function, EF [id number], moderate LVH, grade 1 diastolic dysfunction, and moderate aortic stenosis, mean gradient 29 mmHg.  He underwent left heart catheterization and coronary angiography on 12/15/2013. It demonstrated chronic subtotal occlusion of the right coronary artery with left-to-right collaterals, mild to moderate diffuse LAD and left circumflex coronary artery stenosis.  He is going to see his pulmonologist next week.  Soc: Served in the [place] [employer] on the [employer] in [place]. Married.. They have 2 sons. One is a retired Theme park manager IT sales professional) in [place], [place]. Another son is retired from missions.  Review of Systems: As per "subjective", otherwise negative.  No Known Allergies  Current Outpatient Prescriptions  Medication Sig Dispense Refill  . alfuzosin (UROXATRAL) 10 MG 24 hr tablet Take 10 mg by mouth daily with breakfast.    . aspirin EC 81 MG tablet Take 81 mg by mouth daily.    Marland Kitchen esomeprazole (NEXIUM) 40 MG capsule Take 40 mg by mouth daily at 12 noon.    . Fluticasone Propionate (FLONASE ALLERGY RELIEF NA) Place into the nose 2 (two) times daily.    Marland Kitchen levothyroxine (SYNTHROID, LEVOTHROID) 125 MCG tablet Take 125 mcg by mouth daily before breakfast.    . Multiple Vitamin (MULTIVITAMIN) capsule Take 1 capsule by mouth daily.    . mycophenolate (CELLCEPT) 500 MG tablet Take 500-1,000 mg by mouth 2 (two) times daily. *takes 1000mg  in the morning and 500mg  at night*    . niacin (NIASPAN) 500 MG CR tablet Take 500 mg by mouth 2 (two) times daily.     . Omega-3 Fatty Acids (FISH OIL PO) Take 2 tablets by mouth daily.     . OXYGEN-HELIUM IN 3-5 L.     . predniSONE (DELTASONE) 20 MG tablet Take 10 mg by mouth  daily with breakfast.     . risedronate (ACTONEL) 35 MG tablet Take 35 mg by mouth every 7 (seven) days. with water on empty stomach, nothing by mouth or lie down for next 30 minutes.    Marland Kitchen atorvastatin (LIPITOR) 80 MG tablet Take 80 mg by mouth daily. Reported on 08/20/2015    . fluticasone furoate-vilanterol (BREO ELLIPTA) 100-25 MCG/INH AEPB Inhale 1 puff into the lungs daily. Reported on 08/20/2015    . omeprazole-sodium bicarbonate (ZEGERID) 40-1100 MG capsule Take 1 capsule by mouth daily before breakfast. Reported on 08/20/2015     No current facility-administered medications for this visit.    Past Medical History  Diagnosis Date  . AAA (abdominal aortic aneurysm) ([place])     Doppler (Dr Trena Platt office 07/2010...stable 3.2)  . Ejection fraction     Normal ejection fraction, echo, October 14, 2010,  Dr Manuella Ghazi office,, reports scan the disc E. MR  . Carotid artery disease ([place])     Doppler, Dr. Manuella Ghazi office, June, 2012, report scanned to this EMR, less than 50% bilateral  . Severe aortic stenosis 07/02/2010    Qualifier: Diagnosis of  By: Ron Parker, MD, Caffie Damme   . COPD (chronic obstructive pulmonary disease) ([place])   . Hypothyroidism   . Hyperlipidemia   . Interstitial lung disease ([place])     followed at [facility]  . Connective tissue disease ([place])     followed at [facility] interstitial  lung clinic    Past Surgical History  Procedure Laterality Date  . Hernia repair Bilateral     bilateral inguinal hernia repair  . Partial turp  03/01/2014    DR. BAUER  . Left and right heart catheterization with coronary angiogram N/A 12/15/2013    Procedure: LEFT AND RIGHT HEART CATHETERIZATION WITH CORONARY ANGIOGRAM;  Surgeon: Blane Ohara, MD;  Location: [facility] CATH LAB;  Service: Cardiovascular;  Laterality: N/A;  . Colonoscopy  1999    Dr. Lindalou Hose, reportedly had anal stenosis   . Colonoscopy  2010    ?normal per PCP records    Social History   Social History  . Marital Status: Married     Spouse Name: N/A  . Number of Children: N/A  . Years of Education: N/A   Occupational History  . Not on file.   Social History Main Topics  . Smoking status: Former Smoker -- 1.00 packs/day for 25 years    Types: Cigarettes    Start date: 02/18/1940    Quit date: 12/09/1964  . Smokeless tobacco: Never Used  . Alcohol Use: No  . Drug Use: No  . Sexual Activity: Not on file   Other Topics Concern  . Not on file   Social History Narrative     Filed Vitals:   08/20/15 1317  BP: 111/61  Pulse: 58  Height: 5\' 10"  (1.778 m)  Weight: 175 lb (79.379 kg)  SpO2: 99%    PHYSICAL EXAM General: NAD, using oxygen by nasal cannula  Neck: No JVD, no thyromegaly or thyroid nodule.  Lungs: Diffuse dry crackles, no wheezes.  CV: Nondisplaced PMI. Regular rate and irregular rhythm, normal S1/S2, no S3/S4, IV/VI late-peaking systolic murmur best heard over RUSB. No peripheral edema. Abdomen: Soft, no distention.  Skin: Intact without lesions or rashes.  Neurologic: Alert and oriented x 3.  Psych: Normal affect.  Skin: Normal. Musculoskeletal: No gross deformities.  ECG: Most recent ECG reviewed.      ASSESSMENT AND PLAN: 1. Moderate aortic stenosis: Echo results noted above. Will repeat echo in 1 year or more.  2. CAD: Stable ischemic heart disease. Coronary angiogram results as noted above. Continue ASA and Lipitor as he is tolerating medications without side effects.  3. AAA: Followed by PCP. 3.2 cm on 08/11/11.  4. Increasing exertional dyspnea: Likely pulmonary in etiology. Going to see his pulmonologist next week.  Dispo: f/u 6 months.   Kate Sable, M.D., F.A.C.C.

## 2015-08-20 NOTE — Patient Instructions (Signed)
Continue all current medications. Your physician wants you to follow up in: 6 months.  You will receive a reminder letter in the mail one-two months in advance.  If you don't receive a letter, please call our office to schedule the follow up appointment   

## 2015-09-10 DIAGNOSIS — Z6826 Body mass index (BMI) 26.0-26.9, adult: Secondary | ICD-10-CM | POA: Diagnosis not present

## 2015-09-10 DIAGNOSIS — Z1389 Encounter for screening for other disorder: Secondary | ICD-10-CM | POA: Diagnosis not present

## 2015-09-10 DIAGNOSIS — Z299 Encounter for prophylactic measures, unspecified: Secondary | ICD-10-CM | POA: Diagnosis not present

## 2015-09-10 DIAGNOSIS — Z0000 Encounter for general adult medical examination without abnormal findings: Secondary | ICD-10-CM | POA: Diagnosis not present

## 2015-09-10 DIAGNOSIS — Z7189 Other specified counseling: Secondary | ICD-10-CM | POA: Diagnosis not present

## 2015-09-10 DIAGNOSIS — Z1211 Encounter for screening for malignant neoplasm of colon: Secondary | ICD-10-CM | POA: Diagnosis not present

## 2015-09-11 DIAGNOSIS — R5383 Other fatigue: Secondary | ICD-10-CM | POA: Diagnosis not present

## 2015-09-11 DIAGNOSIS — E039 Hypothyroidism, unspecified: Secondary | ICD-10-CM | POA: Diagnosis not present

## 2015-09-11 DIAGNOSIS — Z125 Encounter for screening for malignant neoplasm of prostate: Secondary | ICD-10-CM | POA: Diagnosis not present

## 2015-09-11 DIAGNOSIS — E7800 Pure hypercholesterolemia, unspecified: Secondary | ICD-10-CM | POA: Diagnosis not present

## 2015-09-13 DIAGNOSIS — R351 Nocturia: Secondary | ICD-10-CM | POA: Diagnosis not present

## 2015-09-13 DIAGNOSIS — R350 Frequency of micturition: Secondary | ICD-10-CM | POA: Diagnosis not present

## 2015-09-17 DIAGNOSIS — N189 Chronic kidney disease, unspecified: Secondary | ICD-10-CM | POA: Diagnosis not present

## 2015-09-17 DIAGNOSIS — R0609 Other forms of dyspnea: Secondary | ICD-10-CM | POA: Diagnosis not present

## 2015-09-17 DIAGNOSIS — J438 Other emphysema: Secondary | ICD-10-CM | POA: Diagnosis not present

## 2015-09-17 DIAGNOSIS — J849 Interstitial pulmonary disease, unspecified: Secondary | ICD-10-CM | POA: Diagnosis not present

## 2015-09-17 DIAGNOSIS — J84111 Idiopathic interstitial pneumonia, not otherwise specified: Secondary | ICD-10-CM | POA: Diagnosis not present

## 2015-09-17 DIAGNOSIS — I350 Nonrheumatic aortic (valve) stenosis: Secondary | ICD-10-CM | POA: Diagnosis not present

## 2015-09-17 DIAGNOSIS — Z9861 Coronary angioplasty status: Secondary | ICD-10-CM | POA: Diagnosis not present

## 2015-09-17 DIAGNOSIS — I2510 Atherosclerotic heart disease of native coronary artery without angina pectoris: Secondary | ICD-10-CM | POA: Diagnosis not present

## 2015-09-17 DIAGNOSIS — Z8719 Personal history of other diseases of the digestive system: Secondary | ICD-10-CM | POA: Diagnosis not present

## 2015-09-17 DIAGNOSIS — I714 Abdominal aortic aneurysm, without rupture: Secondary | ICD-10-CM | POA: Diagnosis not present

## 2015-09-17 DIAGNOSIS — Z79899 Other long term (current) drug therapy: Secondary | ICD-10-CM | POA: Diagnosis not present

## 2015-09-17 DIAGNOSIS — R918 Other nonspecific abnormal finding of lung field: Secondary | ICD-10-CM | POA: Diagnosis not present

## 2015-09-17 DIAGNOSIS — J8410 Pulmonary fibrosis, unspecified: Secondary | ICD-10-CM | POA: Diagnosis not present

## 2015-10-05 DIAGNOSIS — R350 Frequency of micturition: Secondary | ICD-10-CM | POA: Diagnosis not present

## 2015-11-26 DIAGNOSIS — Z961 Presence of intraocular lens: Secondary | ICD-10-CM | POA: Diagnosis not present

## 2015-12-20 DIAGNOSIS — R682 Dry mouth, unspecified: Secondary | ICD-10-CM | POA: Diagnosis not present

## 2015-12-20 DIAGNOSIS — J849 Interstitial pulmonary disease, unspecified: Secondary | ICD-10-CM | POA: Diagnosis not present

## 2015-12-20 DIAGNOSIS — I714 Abdominal aortic aneurysm, without rupture: Secondary | ICD-10-CM | POA: Diagnosis not present

## 2015-12-20 DIAGNOSIS — J449 Chronic obstructive pulmonary disease, unspecified: Secondary | ICD-10-CM | POA: Diagnosis not present

## 2016-01-16 DIAGNOSIS — R42 Dizziness and giddiness: Secondary | ICD-10-CM | POA: Diagnosis not present

## 2016-01-16 DIAGNOSIS — R531 Weakness: Secondary | ICD-10-CM | POA: Diagnosis not present

## 2016-01-16 DIAGNOSIS — I959 Hypotension, unspecified: Secondary | ICD-10-CM | POA: Diagnosis not present

## 2016-01-16 DIAGNOSIS — L409 Psoriasis, unspecified: Secondary | ICD-10-CM | POA: Diagnosis not present

## 2016-01-16 DIAGNOSIS — E860 Dehydration: Secondary | ICD-10-CM | POA: Diagnosis not present

## 2016-01-16 DIAGNOSIS — I714 Abdominal aortic aneurysm, without rupture: Secondary | ICD-10-CM | POA: Diagnosis not present

## 2016-01-16 DIAGNOSIS — J849 Interstitial pulmonary disease, unspecified: Secondary | ICD-10-CM | POA: Diagnosis not present

## 2016-01-16 DIAGNOSIS — N400 Enlarged prostate without lower urinary tract symptoms: Secondary | ICD-10-CM | POA: Diagnosis not present

## 2016-01-16 DIAGNOSIS — M810 Age-related osteoporosis without current pathological fracture: Secondary | ICD-10-CM | POA: Diagnosis not present

## 2016-01-16 DIAGNOSIS — Z7982 Long term (current) use of aspirin: Secondary | ICD-10-CM | POA: Diagnosis not present

## 2016-01-16 DIAGNOSIS — G629 Polyneuropathy, unspecified: Secondary | ICD-10-CM | POA: Diagnosis not present

## 2016-01-16 DIAGNOSIS — Z299 Encounter for prophylactic measures, unspecified: Secondary | ICD-10-CM | POA: Diagnosis not present

## 2016-01-16 DIAGNOSIS — K219 Gastro-esophageal reflux disease without esophagitis: Secondary | ICD-10-CM | POA: Diagnosis not present

## 2016-01-16 DIAGNOSIS — Z7952 Long term (current) use of systemic steroids: Secondary | ICD-10-CM | POA: Diagnosis not present

## 2016-01-16 DIAGNOSIS — J449 Chronic obstructive pulmonary disease, unspecified: Secondary | ICD-10-CM | POA: Diagnosis not present

## 2016-01-16 DIAGNOSIS — E7800 Pure hypercholesterolemia, unspecified: Secondary | ICD-10-CM | POA: Diagnosis not present

## 2016-01-16 DIAGNOSIS — Z79899 Other long term (current) drug therapy: Secondary | ICD-10-CM | POA: Diagnosis not present

## 2016-01-16 DIAGNOSIS — I259 Chronic ischemic heart disease, unspecified: Secondary | ICD-10-CM | POA: Diagnosis not present

## 2016-01-17 DIAGNOSIS — K219 Gastro-esophageal reflux disease without esophagitis: Secondary | ICD-10-CM | POA: Diagnosis not present

## 2016-01-17 DIAGNOSIS — I959 Hypotension, unspecified: Secondary | ICD-10-CM | POA: Diagnosis not present

## 2016-01-17 DIAGNOSIS — I714 Abdominal aortic aneurysm, without rupture: Secondary | ICD-10-CM | POA: Diagnosis not present

## 2016-01-17 DIAGNOSIS — R42 Dizziness and giddiness: Secondary | ICD-10-CM | POA: Diagnosis not present

## 2016-01-17 DIAGNOSIS — R531 Weakness: Secondary | ICD-10-CM | POA: Diagnosis not present

## 2016-01-17 DIAGNOSIS — J849 Interstitial pulmonary disease, unspecified: Secondary | ICD-10-CM | POA: Diagnosis not present

## 2016-01-18 DIAGNOSIS — R531 Weakness: Secondary | ICD-10-CM | POA: Diagnosis not present

## 2016-01-18 DIAGNOSIS — R42 Dizziness and giddiness: Secondary | ICD-10-CM | POA: Diagnosis not present

## 2016-01-18 DIAGNOSIS — J849 Interstitial pulmonary disease, unspecified: Secondary | ICD-10-CM | POA: Diagnosis not present

## 2016-01-18 DIAGNOSIS — I714 Abdominal aortic aneurysm, without rupture: Secondary | ICD-10-CM | POA: Diagnosis not present

## 2016-01-18 DIAGNOSIS — K219 Gastro-esophageal reflux disease without esophagitis: Secondary | ICD-10-CM | POA: Diagnosis not present

## 2016-01-18 DIAGNOSIS — I959 Hypotension, unspecified: Secondary | ICD-10-CM | POA: Diagnosis not present

## 2016-01-24 DIAGNOSIS — Z23 Encounter for immunization: Secondary | ICD-10-CM | POA: Diagnosis not present

## 2016-01-24 DIAGNOSIS — Z79899 Other long term (current) drug therapy: Secondary | ICD-10-CM | POA: Diagnosis not present

## 2016-01-24 DIAGNOSIS — N182 Chronic kidney disease, stage 2 (mild): Secondary | ICD-10-CM | POA: Diagnosis not present

## 2016-01-24 DIAGNOSIS — I714 Abdominal aortic aneurysm, without rupture: Secondary | ICD-10-CM | POA: Diagnosis not present

## 2016-01-24 DIAGNOSIS — Z87891 Personal history of nicotine dependence: Secondary | ICD-10-CM | POA: Diagnosis not present

## 2016-01-24 DIAGNOSIS — R911 Solitary pulmonary nodule: Secondary | ICD-10-CM | POA: Diagnosis not present

## 2016-01-24 DIAGNOSIS — R0609 Other forms of dyspnea: Secondary | ICD-10-CM | POA: Diagnosis not present

## 2016-01-24 DIAGNOSIS — I2510 Atherosclerotic heart disease of native coronary artery without angina pectoris: Secondary | ICD-10-CM | POA: Diagnosis not present

## 2016-01-24 DIAGNOSIS — J849 Interstitial pulmonary disease, unspecified: Secondary | ICD-10-CM | POA: Diagnosis not present

## 2016-01-24 DIAGNOSIS — J438 Other emphysema: Secondary | ICD-10-CM | POA: Diagnosis not present

## 2016-01-24 DIAGNOSIS — I129 Hypertensive chronic kidney disease with stage 1 through stage 4 chronic kidney disease, or unspecified chronic kidney disease: Secondary | ICD-10-CM | POA: Diagnosis not present

## 2016-01-25 ENCOUNTER — Telehealth: Payer: Self-pay | Admitting: Cardiovascular Disease

## 2016-01-25 NOTE — Telephone Encounter (Signed)
Returned call - husband not at home.  Spoke with wife Luellen Pucker - asked him to return call as she wasn't sure exactly what he needed.

## 2016-01-25 NOTE — Telephone Encounter (Signed)
Mr. Charity walked into the office today wanting to speak with Dr. Bronson Ing in regards to him having a surgical procedure performed on him.

## 2016-01-28 DIAGNOSIS — E860 Dehydration: Secondary | ICD-10-CM | POA: Diagnosis not present

## 2016-01-28 DIAGNOSIS — J849 Interstitial pulmonary disease, unspecified: Secondary | ICD-10-CM | POA: Diagnosis not present

## 2016-01-28 DIAGNOSIS — J449 Chronic obstructive pulmonary disease, unspecified: Secondary | ICD-10-CM | POA: Diagnosis not present

## 2016-01-28 DIAGNOSIS — I350 Nonrheumatic aortic (valve) stenosis: Secondary | ICD-10-CM | POA: Diagnosis not present

## 2016-01-28 DIAGNOSIS — Z299 Encounter for prophylactic measures, unspecified: Secondary | ICD-10-CM | POA: Diagnosis not present

## 2016-01-28 DIAGNOSIS — Z6825 Body mass index (BMI) 25.0-25.9, adult: Secondary | ICD-10-CM | POA: Diagnosis not present

## 2016-01-28 NOTE — Telephone Encounter (Signed)
Pt says at [place] was discussed about valve replacement Dr. Bronson Ing suggested pt be seen by pulmonologist - seen pulmonary who suggested he see [place] cardiologist pt would rather discuss with Dr. Bronson Ing - pt is due for 6 month f/u and will come 10/5 for appt

## 2016-01-31 ENCOUNTER — Ambulatory Visit: Payer: Medicare Other | Admitting: Cardiovascular Disease

## 2016-02-08 DIAGNOSIS — I371 Nonrheumatic pulmonary valve insufficiency: Secondary | ICD-10-CM | POA: Diagnosis not present

## 2016-02-08 DIAGNOSIS — Z87891 Personal history of nicotine dependence: Secondary | ICD-10-CM | POA: Diagnosis not present

## 2016-02-08 DIAGNOSIS — I350 Nonrheumatic aortic (valve) stenosis: Secondary | ICD-10-CM | POA: Diagnosis not present

## 2016-02-08 DIAGNOSIS — I517 Cardiomegaly: Secondary | ICD-10-CM | POA: Diagnosis not present

## 2016-02-08 DIAGNOSIS — Z9981 Dependence on supplemental oxygen: Secondary | ICD-10-CM | POA: Diagnosis not present

## 2016-02-08 DIAGNOSIS — J849 Interstitial pulmonary disease, unspecified: Secondary | ICD-10-CM | POA: Diagnosis not present

## 2016-02-15 DIAGNOSIS — Z299 Encounter for prophylactic measures, unspecified: Secondary | ICD-10-CM | POA: Diagnosis not present

## 2016-02-15 DIAGNOSIS — G459 Transient cerebral ischemic attack, unspecified: Secondary | ICD-10-CM | POA: Diagnosis not present

## 2016-02-15 DIAGNOSIS — Z6827 Body mass index (BMI) 27.0-27.9, adult: Secondary | ICD-10-CM | POA: Diagnosis not present

## 2016-02-15 DIAGNOSIS — Z713 Dietary counseling and surveillance: Secondary | ICD-10-CM | POA: Diagnosis not present

## 2016-02-20 DIAGNOSIS — R55 Syncope and collapse: Secondary | ICD-10-CM | POA: Diagnosis not present

## 2016-02-20 DIAGNOSIS — R279 Unspecified lack of coordination: Secondary | ICD-10-CM | POA: Diagnosis not present

## 2016-02-20 DIAGNOSIS — G319 Degenerative disease of nervous system, unspecified: Secondary | ICD-10-CM | POA: Diagnosis not present

## 2016-03-13 DIAGNOSIS — R350 Frequency of micturition: Secondary | ICD-10-CM | POA: Diagnosis not present

## 2016-03-13 DIAGNOSIS — R351 Nocturia: Secondary | ICD-10-CM | POA: Diagnosis not present

## 2016-03-24 ENCOUNTER — Ambulatory Visit: Payer: Medicare Other | Admitting: Cardiovascular Disease

## 2016-03-31 DIAGNOSIS — Z713 Dietary counseling and surveillance: Secondary | ICD-10-CM | POA: Diagnosis not present

## 2016-03-31 DIAGNOSIS — Z299 Encounter for prophylactic measures, unspecified: Secondary | ICD-10-CM | POA: Diagnosis not present

## 2016-03-31 DIAGNOSIS — J449 Chronic obstructive pulmonary disease, unspecified: Secondary | ICD-10-CM | POA: Diagnosis not present

## 2016-03-31 DIAGNOSIS — Z87891 Personal history of nicotine dependence: Secondary | ICD-10-CM | POA: Diagnosis not present

## 2016-03-31 DIAGNOSIS — Z6825 Body mass index (BMI) 25.0-25.9, adult: Secondary | ICD-10-CM | POA: Diagnosis not present

## 2016-05-16 DIAGNOSIS — J849 Interstitial pulmonary disease, unspecified: Secondary | ICD-10-CM | POA: Diagnosis not present

## 2016-05-16 DIAGNOSIS — I350 Nonrheumatic aortic (valve) stenosis: Secondary | ICD-10-CM | POA: Diagnosis not present

## 2016-05-16 DIAGNOSIS — R0902 Hypoxemia: Secondary | ICD-10-CM | POA: Diagnosis not present

## 2016-05-16 DIAGNOSIS — J439 Emphysema, unspecified: Secondary | ICD-10-CM | POA: Diagnosis not present

## 2016-05-21 DIAGNOSIS — Z8673 Personal history of transient ischemic attack (TIA), and cerebral infarction without residual deficits: Secondary | ICD-10-CM | POA: Diagnosis not present

## 2016-05-21 DIAGNOSIS — Z9981 Dependence on supplemental oxygen: Secondary | ICD-10-CM | POA: Diagnosis not present

## 2016-05-21 DIAGNOSIS — Z952 Presence of prosthetic heart valve: Secondary | ICD-10-CM | POA: Diagnosis not present

## 2016-05-21 DIAGNOSIS — Z7982 Long term (current) use of aspirin: Secondary | ICD-10-CM | POA: Diagnosis not present

## 2016-05-21 DIAGNOSIS — I714 Abdominal aortic aneurysm, without rupture: Secondary | ICD-10-CM | POA: Diagnosis not present

## 2016-05-21 DIAGNOSIS — E039 Hypothyroidism, unspecified: Secondary | ICD-10-CM | POA: Diagnosis not present

## 2016-05-21 DIAGNOSIS — N400 Enlarged prostate without lower urinary tract symptoms: Secondary | ICD-10-CM | POA: Diagnosis not present

## 2016-05-21 DIAGNOSIS — I350 Nonrheumatic aortic (valve) stenosis: Secondary | ICD-10-CM | POA: Diagnosis not present

## 2016-05-21 DIAGNOSIS — I129 Hypertensive chronic kidney disease with stage 1 through stage 4 chronic kidney disease, or unspecified chronic kidney disease: Secondary | ICD-10-CM | POA: Diagnosis not present

## 2016-05-21 DIAGNOSIS — Z5181 Encounter for therapeutic drug level monitoring: Secondary | ICD-10-CM | POA: Diagnosis not present

## 2016-05-21 DIAGNOSIS — J849 Interstitial pulmonary disease, unspecified: Secondary | ICD-10-CM | POA: Diagnosis not present

## 2016-05-21 DIAGNOSIS — J8410 Pulmonary fibrosis, unspecified: Secondary | ICD-10-CM | POA: Diagnosis not present

## 2016-05-21 DIAGNOSIS — Z87891 Personal history of nicotine dependence: Secondary | ICD-10-CM | POA: Diagnosis not present

## 2016-05-21 DIAGNOSIS — D649 Anemia, unspecified: Secondary | ICD-10-CM | POA: Diagnosis not present

## 2016-05-21 DIAGNOSIS — N183 Chronic kidney disease, stage 3 (moderate): Secondary | ICD-10-CM | POA: Diagnosis not present

## 2016-05-21 DIAGNOSIS — Z79899 Other long term (current) drug therapy: Secondary | ICD-10-CM | POA: Diagnosis not present

## 2016-05-21 DIAGNOSIS — E0590 Thyrotoxicosis, unspecified without thyrotoxic crisis or storm: Secondary | ICD-10-CM | POA: Diagnosis not present

## 2016-05-21 DIAGNOSIS — E7800 Pure hypercholesterolemia, unspecified: Secondary | ICD-10-CM | POA: Diagnosis not present

## 2016-05-21 DIAGNOSIS — K219 Gastro-esophageal reflux disease without esophagitis: Secondary | ICD-10-CM | POA: Diagnosis not present

## 2016-05-21 DIAGNOSIS — I2510 Atherosclerotic heart disease of native coronary artery without angina pectoris: Secondary | ICD-10-CM | POA: Diagnosis not present

## 2016-05-22 DIAGNOSIS — I2510 Atherosclerotic heart disease of native coronary artery without angina pectoris: Secondary | ICD-10-CM | POA: Diagnosis not present

## 2016-05-22 DIAGNOSIS — J8410 Pulmonary fibrosis, unspecified: Secondary | ICD-10-CM | POA: Diagnosis not present

## 2016-05-22 DIAGNOSIS — I350 Nonrheumatic aortic (valve) stenosis: Secondary | ICD-10-CM | POA: Diagnosis not present

## 2016-05-22 DIAGNOSIS — Z9981 Dependence on supplemental oxygen: Secondary | ICD-10-CM | POA: Diagnosis not present

## 2016-05-22 DIAGNOSIS — I129 Hypertensive chronic kidney disease with stage 1 through stage 4 chronic kidney disease, or unspecified chronic kidney disease: Secondary | ICD-10-CM | POA: Diagnosis not present

## 2016-05-22 DIAGNOSIS — N183 Chronic kidney disease, stage 3 (moderate): Secondary | ICD-10-CM | POA: Diagnosis not present

## 2016-05-22 DIAGNOSIS — J849 Interstitial pulmonary disease, unspecified: Secondary | ICD-10-CM | POA: Diagnosis not present

## 2016-05-23 DIAGNOSIS — Z9981 Dependence on supplemental oxygen: Secondary | ICD-10-CM | POA: Diagnosis not present

## 2016-05-23 DIAGNOSIS — Z952 Presence of prosthetic heart valve: Secondary | ICD-10-CM | POA: Diagnosis not present

## 2016-05-23 DIAGNOSIS — N183 Chronic kidney disease, stage 3 (moderate): Secondary | ICD-10-CM | POA: Diagnosis not present

## 2016-05-23 DIAGNOSIS — I350 Nonrheumatic aortic (valve) stenosis: Secondary | ICD-10-CM | POA: Diagnosis not present

## 2016-05-23 DIAGNOSIS — J849 Interstitial pulmonary disease, unspecified: Secondary | ICD-10-CM | POA: Diagnosis not present

## 2016-05-23 DIAGNOSIS — I129 Hypertensive chronic kidney disease with stage 1 through stage 4 chronic kidney disease, or unspecified chronic kidney disease: Secondary | ICD-10-CM | POA: Diagnosis not present

## 2016-05-23 DIAGNOSIS — R911 Solitary pulmonary nodule: Secondary | ICD-10-CM | POA: Diagnosis not present

## 2016-05-23 DIAGNOSIS — I2510 Atherosclerotic heart disease of native coronary artery without angina pectoris: Secondary | ICD-10-CM | POA: Diagnosis not present

## 2016-05-23 DIAGNOSIS — J8410 Pulmonary fibrosis, unspecified: Secondary | ICD-10-CM | POA: Diagnosis not present

## 2016-05-28 DIAGNOSIS — J449 Chronic obstructive pulmonary disease, unspecified: Secondary | ICD-10-CM | POA: Diagnosis not present

## 2016-05-30 DIAGNOSIS — N289 Disorder of kidney and ureter, unspecified: Secondary | ICD-10-CM | POA: Diagnosis not present

## 2016-05-30 DIAGNOSIS — I2583 Coronary atherosclerosis due to lipid rich plaque: Secondary | ICD-10-CM | POA: Diagnosis not present

## 2016-05-30 DIAGNOSIS — I2510 Atherosclerotic heart disease of native coronary artery without angina pectoris: Secondary | ICD-10-CM | POA: Diagnosis not present

## 2016-05-30 DIAGNOSIS — I350 Nonrheumatic aortic (valve) stenosis: Secondary | ICD-10-CM | POA: Diagnosis not present

## 2016-06-02 DIAGNOSIS — Z8673 Personal history of transient ischemic attack (TIA), and cerebral infarction without residual deficits: Secondary | ICD-10-CM | POA: Diagnosis not present

## 2016-06-02 DIAGNOSIS — Z806 Family history of leukemia: Secondary | ICD-10-CM | POA: Diagnosis not present

## 2016-06-02 DIAGNOSIS — R509 Fever, unspecified: Secondary | ICD-10-CM | POA: Diagnosis not present

## 2016-06-02 DIAGNOSIS — Z79899 Other long term (current) drug therapy: Secondary | ICD-10-CM | POA: Diagnosis not present

## 2016-06-02 DIAGNOSIS — I70201 Unspecified atherosclerosis of native arteries of extremities, right leg: Secondary | ICD-10-CM | POA: Diagnosis present

## 2016-06-02 DIAGNOSIS — E0590 Thyrotoxicosis, unspecified without thyrotoxic crisis or storm: Secondary | ICD-10-CM | POA: Diagnosis present

## 2016-06-02 DIAGNOSIS — Z952 Presence of prosthetic heart valve: Secondary | ICD-10-CM | POA: Diagnosis not present

## 2016-06-02 DIAGNOSIS — I2510 Atherosclerotic heart disease of native coronary artery without angina pectoris: Secondary | ICD-10-CM | POA: Diagnosis present

## 2016-06-02 DIAGNOSIS — Z006 Encounter for examination for normal comparison and control in clinical research program: Secondary | ICD-10-CM | POA: Diagnosis not present

## 2016-06-02 DIAGNOSIS — Z7982 Long term (current) use of aspirin: Secondary | ICD-10-CM | POA: Diagnosis not present

## 2016-06-02 DIAGNOSIS — I7090 Unspecified atherosclerosis: Secondary | ICD-10-CM | POA: Diagnosis not present

## 2016-06-02 DIAGNOSIS — I350 Nonrheumatic aortic (valve) stenosis: Secondary | ICD-10-CM | POA: Diagnosis present

## 2016-06-02 DIAGNOSIS — Z9981 Dependence on supplemental oxygen: Secondary | ICD-10-CM | POA: Diagnosis not present

## 2016-06-02 DIAGNOSIS — R54 Age-related physical debility: Secondary | ICD-10-CM | POA: Diagnosis present

## 2016-06-02 DIAGNOSIS — I517 Cardiomegaly: Secondary | ICD-10-CM | POA: Diagnosis not present

## 2016-06-02 DIAGNOSIS — Z8249 Family history of ischemic heart disease and other diseases of the circulatory system: Secondary | ICD-10-CM | POA: Diagnosis not present

## 2016-06-02 DIAGNOSIS — D649 Anemia, unspecified: Secondary | ICD-10-CM | POA: Diagnosis present

## 2016-06-02 DIAGNOSIS — Z9225 Personal history of immunosupression therapy: Secondary | ICD-10-CM | POA: Diagnosis not present

## 2016-06-02 DIAGNOSIS — I2583 Coronary atherosclerosis due to lipid rich plaque: Secondary | ICD-10-CM | POA: Diagnosis present

## 2016-06-02 DIAGNOSIS — I739 Peripheral vascular disease, unspecified: Secondary | ICD-10-CM | POA: Diagnosis present

## 2016-06-02 DIAGNOSIS — D696 Thrombocytopenia, unspecified: Secondary | ICD-10-CM | POA: Diagnosis not present

## 2016-06-02 DIAGNOSIS — E7800 Pure hypercholesterolemia, unspecified: Secondary | ICD-10-CM | POA: Diagnosis present

## 2016-06-02 DIAGNOSIS — I959 Hypotension, unspecified: Secondary | ICD-10-CM | POA: Diagnosis not present

## 2016-06-02 DIAGNOSIS — N183 Chronic kidney disease, stage 3 (moderate): Secondary | ICD-10-CM | POA: Diagnosis present

## 2016-06-02 DIAGNOSIS — Z87891 Personal history of nicotine dependence: Secondary | ICD-10-CM | POA: Diagnosis not present

## 2016-06-02 DIAGNOSIS — J984 Other disorders of lung: Secondary | ICD-10-CM | POA: Diagnosis not present

## 2016-06-02 DIAGNOSIS — R918 Other nonspecific abnormal finding of lung field: Secondary | ICD-10-CM | POA: Diagnosis not present

## 2016-06-02 DIAGNOSIS — I714 Abdominal aortic aneurysm, without rupture: Secondary | ICD-10-CM | POA: Diagnosis present

## 2016-06-02 DIAGNOSIS — I130 Hypertensive heart and chronic kidney disease with heart failure and stage 1 through stage 4 chronic kidney disease, or unspecified chronic kidney disease: Secondary | ICD-10-CM | POA: Diagnosis present

## 2016-06-02 DIAGNOSIS — I5032 Chronic diastolic (congestive) heart failure: Secondary | ICD-10-CM | POA: Diagnosis present

## 2016-06-02 DIAGNOSIS — I352 Nonrheumatic aortic (valve) stenosis with insufficiency: Secondary | ICD-10-CM | POA: Diagnosis not present

## 2016-06-02 DIAGNOSIS — J849 Interstitial pulmonary disease, unspecified: Secondary | ICD-10-CM | POA: Diagnosis present

## 2016-06-03 DIAGNOSIS — I350 Nonrheumatic aortic (valve) stenosis: Secondary | ICD-10-CM | POA: Diagnosis not present

## 2016-06-03 DIAGNOSIS — I7090 Unspecified atherosclerosis: Secondary | ICD-10-CM | POA: Diagnosis not present

## 2016-06-11 DIAGNOSIS — R911 Solitary pulmonary nodule: Secondary | ICD-10-CM | POA: Diagnosis not present

## 2016-06-11 DIAGNOSIS — J84112 Idiopathic pulmonary fibrosis: Secondary | ICD-10-CM | POA: Diagnosis not present

## 2016-06-11 DIAGNOSIS — I714 Abdominal aortic aneurysm, without rupture: Secondary | ICD-10-CM | POA: Diagnosis not present

## 2016-06-11 DIAGNOSIS — Z9889 Other specified postprocedural states: Secondary | ICD-10-CM | POA: Diagnosis not present

## 2016-06-11 DIAGNOSIS — M359 Systemic involvement of connective tissue, unspecified: Secondary | ICD-10-CM | POA: Diagnosis not present

## 2016-06-11 DIAGNOSIS — R0609 Other forms of dyspnea: Secondary | ICD-10-CM | POA: Diagnosis not present

## 2016-06-11 DIAGNOSIS — J849 Interstitial pulmonary disease, unspecified: Secondary | ICD-10-CM | POA: Diagnosis not present

## 2016-06-11 DIAGNOSIS — N189 Chronic kidney disease, unspecified: Secondary | ICD-10-CM | POA: Diagnosis not present

## 2016-06-11 DIAGNOSIS — R0902 Hypoxemia: Secondary | ICD-10-CM | POA: Diagnosis not present

## 2016-06-11 DIAGNOSIS — J439 Emphysema, unspecified: Secondary | ICD-10-CM | POA: Diagnosis not present

## 2016-06-11 DIAGNOSIS — Z79899 Other long term (current) drug therapy: Secondary | ICD-10-CM | POA: Diagnosis not present

## 2016-06-11 DIAGNOSIS — Z8719 Personal history of other diseases of the digestive system: Secondary | ICD-10-CM | POA: Diagnosis not present

## 2016-06-11 DIAGNOSIS — J984 Other disorders of lung: Secondary | ICD-10-CM | POA: Diagnosis not present

## 2016-06-18 DIAGNOSIS — Z6825 Body mass index (BMI) 25.0-25.9, adult: Secondary | ICD-10-CM | POA: Diagnosis not present

## 2016-06-18 DIAGNOSIS — J849 Interstitial pulmonary disease, unspecified: Secondary | ICD-10-CM | POA: Diagnosis not present

## 2016-06-18 DIAGNOSIS — Z713 Dietary counseling and surveillance: Secondary | ICD-10-CM | POA: Diagnosis not present

## 2016-06-18 DIAGNOSIS — Z299 Encounter for prophylactic measures, unspecified: Secondary | ICD-10-CM | POA: Diagnosis not present

## 2016-06-18 DIAGNOSIS — Z87891 Personal history of nicotine dependence: Secondary | ICD-10-CM | POA: Diagnosis not present

## 2016-06-18 DIAGNOSIS — I714 Abdominal aortic aneurysm, without rupture: Secondary | ICD-10-CM | POA: Diagnosis not present

## 2016-06-18 DIAGNOSIS — Z952 Presence of prosthetic heart valve: Secondary | ICD-10-CM | POA: Diagnosis not present

## 2016-06-18 DIAGNOSIS — J449 Chronic obstructive pulmonary disease, unspecified: Secondary | ICD-10-CM | POA: Diagnosis not present

## 2016-06-26 HISTORY — PX: ESOPHAGOGASTRODUODENOSCOPY: SHX1529

## 2016-07-01 DIAGNOSIS — I361 Nonrheumatic tricuspid (valve) insufficiency: Secondary | ICD-10-CM | POA: Diagnosis not present

## 2016-07-01 DIAGNOSIS — I519 Heart disease, unspecified: Secondary | ICD-10-CM | POA: Diagnosis not present

## 2016-07-01 DIAGNOSIS — I491 Atrial premature depolarization: Secondary | ICD-10-CM | POA: Diagnosis not present

## 2016-07-01 DIAGNOSIS — Z952 Presence of prosthetic heart valve: Secondary | ICD-10-CM | POA: Diagnosis not present

## 2016-07-01 DIAGNOSIS — I517 Cardiomegaly: Secondary | ICD-10-CM | POA: Diagnosis not present

## 2016-07-01 DIAGNOSIS — I340 Nonrheumatic mitral (valve) insufficiency: Secondary | ICD-10-CM | POA: Diagnosis not present

## 2016-07-01 DIAGNOSIS — Z9889 Other specified postprocedural states: Secondary | ICD-10-CM | POA: Diagnosis not present

## 2016-07-01 DIAGNOSIS — R918 Other nonspecific abnormal finding of lung field: Secondary | ICD-10-CM | POA: Diagnosis not present

## 2016-07-01 DIAGNOSIS — Z48812 Encounter for surgical aftercare following surgery on the circulatory system: Secondary | ICD-10-CM | POA: Diagnosis not present

## 2016-07-01 DIAGNOSIS — I371 Nonrheumatic pulmonary valve insufficiency: Secondary | ICD-10-CM | POA: Diagnosis not present

## 2016-07-11 ENCOUNTER — Encounter (HOSPITAL_COMMUNITY)
Admission: RE | Admit: 2016-07-11 | Discharge: 2016-07-11 | Disposition: A | Discharge status: Home / Self Care | Payer: Medicare Other | Source: Ambulatory Visit | Attending: Thoracic Surgery (Cardiothoracic Vascular Surgery) | Admitting: Thoracic Surgery (Cardiothoracic Vascular Surgery)

## 2016-07-11 VITALS — BP 92/52 | HR 83 | Ht 70.0 in | Wt 171.5 lb

## 2016-07-11 DIAGNOSIS — Z952 Presence of prosthetic heart valve: Secondary | ICD-10-CM

## 2016-07-11 DIAGNOSIS — Z954 Presence of other heart-valve replacement: Secondary | ICD-10-CM | POA: Insufficient documentation

## 2016-07-11 NOTE — Progress Notes (Signed)
Cardiac Individual Treatment Plan  Patient Details  Name: Rodney Bowman MRN: [id number] Date of Birth: 1921/06/11 Referring Provider:     CARDIAC REHAB PHASE II ORIENTATION from 07/11/2016 in [place]  Referring Provider  Dr. Ysidro Evert      Initial Encounter Date:    CARDIAC REHAB PHASE II ORIENTATION from 07/11/2016 in [place]  Date  07/11/16  Referring Provider  Dr. Ysidro Evert      Visit Diagnosis: S/P TAVR (transcatheter aortic valve replacement)  Patient's Home Medications on Admission:  Current Outpatient Prescriptions:  .  alfuzosin (UROXATRAL) 10 MG 24 hr tablet, Take 10 mg by mouth daily with breakfast., Disp: , Rfl:  .  aspirin EC 81 MG tablet, Take 81 mg by mouth daily., Disp: , Rfl:  .  atorvastatin (LIPITOR) 80 MG tablet, Take 80 mg by mouth daily. Reported on 08/20/2015, Disp: , Rfl:  .  esomeprazole (NEXIUM) 40 MG capsule, Take 40 mg by mouth daily at 12 noon., Disp: , Rfl:  .  fluticasone furoate-vilanterol (BREO ELLIPTA) 100-25 MCG/INH AEPB, Inhale 1 puff into the lungs daily. Reported on 08/20/2015, Disp: , Rfl:  .  Fluticasone Propionate (FLONASE ALLERGY RELIEF NA), Place into the nose 2 (two) times daily., Disp: , Rfl:  .  levothyroxine (SYNTHROID, LEVOTHROID) 125 MCG tablet, Take 125 mcg by mouth daily before breakfast., Disp: , Rfl:  .  Multiple Vitamin (MULTIVITAMIN) capsule, Take 1 capsule by mouth daily., Disp: , Rfl:  .  mycophenolate (CELLCEPT) 500 MG tablet, Take 500-1,000 mg by mouth 2 (two) times daily. *takes 1000mg  in the morning and 500mg  at night*, Disp: , Rfl:  .  niacin (NIASPAN) 500 MG CR tablet, Take 500 mg by mouth 2 (two) times daily. , Disp: , Rfl:  .  Omega-3 Fatty Acids (FISH OIL PO), Take 2 tablets by mouth daily. , Disp: , Rfl:  .  omeprazole-sodium bicarbonate (ZEGERID) 40-1100 MG capsule, Take 1 capsule by mouth daily before breakfast. Reported on 08/20/2015, Disp: , Rfl:  .  OXYGEN-HELIUM IN, 3-5  L. , Disp: , Rfl:  .  predniSONE (DELTASONE) 20 MG tablet, Take 10 mg by mouth daily with breakfast. , Disp: , Rfl:  .  risedronate (ACTONEL) 35 MG tablet, Take 35 mg by mouth every 7 (seven) days. with water on empty stomach, nothing by mouth or lie down for next 30 minutes., Disp: , Rfl:   Past Medical History: Past Medical History:  Diagnosis Date  . AAA (abdominal aortic aneurysm) ([place])    Doppler (Dr Trena Platt office 07/2010...stable 3.2)  . Carotid artery disease (H[facility])    Doppler, Dr. Manuella Ghazi office, June, 2012, report scanned to this EMR, less than 50% bilateral  . Connective tissue disease ([place])    followed at [facility] interstitial lung clinic  . COPD (chronic obstructive pulmonary disease) ([place])   . Ejection fraction    Normal ejection fraction, echo, October 14, 2010,  Dr Manuella Ghazi office,, reports scan the disc E. MR  . Hyperlipidemia   . Hypothyroidism   . Interstitial lung disease ([place])    followed at [facility]  . Severe aortic stenosis 07/02/2010   Qualifier: Diagnosis of  By: Ron Parker, MD, [facility], [place]     Tobacco Use: History  Smoking Status  . Former Smoker  . Packs/day: 1.00  . Years: 25.00  . Types: Cigarettes  . Start date: 02/18/1940  . Quit date: 12/09/1964  Smokeless Tobacco  . Never Used    Labs: Recent  Review Flowsheet Data    Labs for ITP Cardiac and Pulmonary Rehab Latest Ref Rng & Units 12/15/2013 12/15/2013   PHART 7.350 - 7.450 7.367 -   PCO2ART 35.0 - 45.0 mmHg 41.2 -   HCO3 20.0 - 24.0 mEq/L 23.7 24.4(H)   TCO2 0 - 100 mmol/L 25 26   ACIDBASEDEF 0.0 - 2.0 mmol/L 2.0 1.0   O2SAT % 94.0 68.0      Capillary Blood Glucose: No results found for: GLUCAP   Exercise Target Goals: Date: 07/11/16  Exercise Program Goal: Individual exercise prescription set with THRR, safety & activity barriers. Participant demonstrates ability to understand and report RPE using BORG scale, to self-measure pulse accurately, and to acknowledge the importance of the exercise  prescription.  Exercise Prescription Goal: Starting with aerobic activity 30 plus minutes a day, 3 days per week for initial exercise prescription. Provide home exercise prescription and guidelines that participant acknowledges understanding prior to discharge.  Activity Barriers & Risk Stratification:     Activity Barriers & Cardiac Risk Stratification - 07/11/16 1617      Activity Barriers & Cardiac Risk Stratification   Activity Barriers Muscular Weakness  Hard of Hearing   Cardiac Risk Stratification High      6 Minute Walk: Nustep Test                                      REST                           6-MIN                          POST 2-MIN HR                               83                                97                                92 BP                               92/52                           118/56                         96/48 O2                               98                                92                                99 RPE  6                                  12                                6  RPD                             7                                  12                                9   Distance: .19 miles, 1,052ft.   Ex METs: 1.7  Comments: 11 Watts Level 1. Patient became short of breath during the test but breathing recovered after 2 minute rest period.     Electronically signed by Suzanne Boron at 07/11/2016 3:07 PM       Oxygen Initial Assessment:     Oxygen Initial Assessment - 07/11/16 1604      Home Oxygen   Home Oxygen Device Liquid Oxygen   Sleep Oxygen Prescription Continuous   Liters per minute 4   Liters per minute 5   Home at Rest Exercise Oxygen Prescription Continuous   Liters per minute 3     Initial 6 min Walk   Oxygen Used Continuous   Liters per minute 5   Resting Oxygen Saturation  during 6 min walk 99 %   Exercise Oxygen Saturation  during 6 min walk 92 %     Program  Oxygen Prescription   Program Oxygen Prescription Continuous     Intervention   Short Term Goals To learn and exhibit compliance with exercise, home and travel O2 prescription;To Learn and understand importance of maintaining oxygen saturations>88%   Long  Term Goals Exhibits compliance with exercise, home and travel O2 prescription;Demonstrates proper use of MDI's;Exhibits proper breathing techniques, such as purse lipped breathing or other method taught during program session      Oxygen Re-Evaluation:   Oxygen Discharge (Final Oxygen Re-Evaluation):   Initial Exercise Prescription:     Initial Exercise Prescription - 07/11/16 1500      Date of Initial Exercise RX and Referring Provider   Date 07/11/16   Referring Provider Dr. Ysidro Evert     NuStep   Level 2   SPM 11   Minutes 20   METs 1.8     Arm Ergometer   Level 1.5   Watts 15   RPM 15   Minutes 15   METs 1.6     Prescription Details   Frequency (times per week) 3   Duration Progress to 30 minutes of continuous aerobic without signs/symptoms of physical distress     Intensity   THRR 40-80% of Max Heartrate 101-110-119   Ratings of Perceived Exertion 11-13   Perceived Dyspnea 0-4     Progression   Progression Continue progressive overload as per policy without signs/symptoms or physical distress.     Resistance Training   Training Prescription Yes   Weight 1   Reps 10-15      Perform Capillary Blood Glucose checks as needed.  Exercise Prescription  Changes:   Exercise Comments:   Exercise Goals and Review:     Exercise Goals    Row Name 07/11/16 1618             Exercise Goals   Increase Physical Activity Yes       Intervention Provide advice, education, support and counseling about physical activity/exercise needs.;Develop an individualized exercise prescription for aerobic and resistive training based on initial evaluation findings, risk stratification, comorbidities and participant's  personal goals.       Expected Outcomes Achievement of increased cardiorespiratory fitness and enhanced flexibility, muscular endurance and strength shown through measurements of functional capacity and personal statement of participant.       Increase Strength and Stamina Yes       Intervention Provide advice, education, support and counseling about physical activity/exercise needs.;Develop an individualized exercise prescription for aerobic and resistive training based on initial evaluation findings, risk stratification, comorbidities and participant's personal goals.       Expected Outcomes Achievement of increased cardiorespiratory fitness and enhanced flexibility, muscular endurance and strength shown through measurements of functional capacity and personal statement of participant.          Exercise Goals Re-Evaluation :    Discharge Exercise Prescription (Final Exercise Prescription Changes):   Nutrition:  Target Goals: Understanding of nutrition guidelines, daily intake of sodium 1500mg , cholesterol 200mg , calories 30% from fat and 7% or less from saturated fats, daily to have 5 or more servings of fruits and vegetables.  Biometrics:     Pre Biometrics - 07/11/16 1514      Pre Biometrics   Height 5\' 10"  (1.778 m)   Weight 171 lb 8.3 oz (77.8 kg)   Waist Circumference 36.5 inches   Hip Circumference 41 inches   Waist to Hip Ratio 0.89 %   BMI (Calculated) 24.7   Triceps Skinfold 9 mm   % Body Fat 31.8 %   Grip Strength 65 kg   Flexibility 0 in  Patient could not do flex test   Single Leg Stand 0 seconds  Patient felt too uneasy        Nutrition Therapy Plan and Nutrition Goals:   Nutrition Discharge: Rate Your Plate Scores:     Nutrition Assessments - 07/11/16 1618      MEDFICTS Scores   Pre Score 58      Nutrition Goals Re-Evaluation:   Nutrition Goals Discharge (Final Nutrition Goals Re-Evaluation):   Psychosocial: Target Goals: Acknowledge  presence or absence of significant depression and/or stress, maximize coping skills, provide positive support system. Participant is able to verbalize types and ability to use techniques and skills needed for reducing stress and depression.  Initial Review & Psychosocial Screening:     Initial Psych Review & Screening - 07/11/16 1621      Initial Review   Current issues with None Identified     Family Dynamics   Good Support System? Yes     Barriers   Psychosocial barriers to participate in program There are no identifiable barriers or psychosocial needs.     Screening Interventions   Interventions Encouraged to exercise      Quality of Life Scores:     Quality of Life - 07/11/16 1503      Quality of Life Scores   Health/Function Pre 20.04 %   Socioeconomic Pre 25.43 %   Psych/Spiritual Pre 24 %   Family Pre 24 %   GLOBAL Pre 22.62 %      PHQ-9: Recent  Review Flowsheet Data    Depression screen [facility] 2/9 07/11/2016   Decreased Interest 0   Down, Depressed, Hopeless 0   PHQ - 2 Score 0   Tired, decreased energy 2   Change in appetite 3   Feeling bad or failure about yourself  0   Trouble concentrating 0   Moving slowly or fidgety/restless 0   Suicidal thoughts 0   Difficult doing work/chores Not difficult at all     Interpretation of Total Score  Total Score Depression Severity:  1-4 = Minimal depression, 5-9 = Mild depression, 10-14 = Moderate depression, 15-19 = Moderately severe depression, 20-27 = Severe depression   Psychosocial Evaluation and Intervention:     Psychosocial Evaluation - 07/11/16 1621      Psychosocial Evaluation & Interventions   Interventions Encouraged to exercise with the program and follow exercise prescription   Continue Psychosocial Services  No Follow up required      Psychosocial Re-Evaluation:   Psychosocial Discharge (Final Psychosocial Re-Evaluation):   Vocational Rehabilitation: Provide vocational rehab assistance  to qualifying candidates.   Vocational Rehab Evaluation & Intervention:     Vocational Rehab - 07/11/16 1615      Initial Vocational Rehab Evaluation & Intervention   Assessment shows need for Vocational Rehabilitation No      Education: Education Goals: Education classes will be provided on a weekly basis, covering required topics. Participant will state understanding/return demonstration of topics presented.  Learning Barriers/Preferences:     Learning Barriers/Preferences - 07/11/16 1614      Learning Barriers/Preferences   Learning Barriers Hearing   Learning Preferences Individual Instruction;Group Instruction      Education Topics: Hypertension, Hypertension Reduction -Define heart disease and high blood pressure. Discus how high blood pressure affects the body and ways to reduce high blood pressure.   Exercise and Your Heart -Discuss why it is important to exercise, the FITT principles of exercise, normal and abnormal responses to exercise, and how to exercise safely.   Angina -Discuss definition of angina, causes of angina, treatment of angina, and how to decrease risk of having angina.   Cardiac Medications -Review what the following cardiac medications are used for, how they affect the body, and side effects that may occur when taking the medications.  Medications include Aspirin, Beta blockers, calcium channel blockers, ACE Inhibitors, angiotensin receptor blockers, diuretics, digoxin, and antihyperlipidemics.   Congestive Heart Failure -Discuss the definition of CHF, how to live with CHF, the signs and symptoms of CHF, and how keep track of weight and sodium intake.   Heart Disease and Intimacy -Discus the effect sexual activity has on the heart, how changes occur during intimacy as we age, and safety during sexual activity.   Smoking Cessation / COPD -Discuss different methods to quit smoking, the health benefits of quitting smoking, and the definition  of COPD.   Nutrition I: Fats -Discuss the types of cholesterol, what cholesterol does to the heart, and how cholesterol levels can be controlled.   Nutrition II: Labels -Discuss the different components of food labels and how to read food label   Heart Parts and Heart Disease -Discuss the anatomy of the heart, the pathway of blood circulation through the heart, and these are affected by heart disease.   Stress I: Signs and Symptoms -Discuss the causes of stress, how stress may lead to anxiety and depression, and ways to limit stress.   Stress II: Relaxation -Discuss different types of relaxation techniques to limit stress.  Warning Signs of Stroke / TIA -Discuss definition of a stroke, what the signs and symptoms are of a stroke, and how to identify when someone is having stroke.   Knowledge Questionnaire Score:     Knowledge Questionnaire Score - 07/11/16 1615      Knowledge Questionnaire Score   Pre Score 19/24      Core Components/Risk Factors/Patient Goals at Admission:     Personal Goals and Risk Factors at Admission - 07/11/16 1619      Core Components/Risk Factors/Patient Goals on Admission    Weight Management Weight Maintenance   Improve shortness of breath with ADL's Yes   Intervention Provide education, individualized exercise plan and daily activity instruction to help decrease symptoms of SOB with activities of daily living.   Expected Outcomes Short Term: Achieves a reduction of symptoms when performing activities of daily living.   Personal Goal Other Yes   Personal Goal Increase heart muscle strength, gain physical strength   Intervention Attend CR 3 x week and supplement exercise at home 2 x week   Expected Outcomes To reach personal goals.       Core Components/Risk Factors/Patient Goals Review:      Goals and Risk Factor Review    Row Name 07/11/16 1620             Core Components/Risk Factors/Patient Goals Review   Personal Goals  Review Improve shortness of breath with ADL's  Increase heart muscle strength          Core Components/Risk Factors/Patient Goals at Discharge (Final Review):      Goals and Risk Factor Review - 07/11/16 1620      Core Components/Risk Factors/Patient Goals Review   Personal Goals Review Improve shortness of breath with ADL's  Increase heart muscle strength      ITP Comments:   Comments: Patient arrived for 1st visit/orientation/education at 1330. Patient was referred to CR by Dr. Ysidro Evert from [facility] due to TAVR (Z95.2). During orientation advised patient on arrival and appointment times what to wear, what to do before, during and after exercise. Reviewed attendance and class policy. Talked about inclement weather and class consultation policy. Pt is scheduled to return Cardiac Rehab on 07/17/15 at 11:00. Pt was advised to come to class 15 minutes before class starts. Patient was also given instructions on meeting with the dietician and attending the Family Structure classes. Pt is eager to get started. Participated in warm up stretches followed by light weights and resistance bands. Patient had to do 6 minute Nustep test in stead of 6 minute walk test. Patient was measured for the equipment. Discussed equipment safety with patient. Took patient pre-anthropometric measurements. Patient finished visit at 1630.

## 2016-07-11 NOTE — Progress Notes (Signed)
6 MIN NUSTEP TEST  Date: 07/11/2016 Weight: 77.8KG Height: 70in.      REST   6-MIN   POST 2-MIN HR   83   97   92 BP   92/52   118/56   96/48 O2   98   92   99 RPE   6   12   6  RPD   7   12   9   Distance: .19 miles, 1,072ft.   Ex METs: 1.7  Comments: 11 Watts Level 1. Patient became short of breath during the test but breathing recovered after 2 minute rest period.

## 2016-07-14 NOTE — Progress Notes (Signed)
Cardiac/Pulmonary Rehab Medication Review by a Pharmacist  Does the patient  feel that his/her medications are working for him/her?  n/a  Has the patient been experiencing any side effects to the medications prescribed?  n/a  Does the patient measure his/her own blood pressure or blood glucose at home?  n/a   Does the patient have any problems obtaining medications due to transportation or finances?   n/a  Understanding of regimen: n/a Understanding of indications: n/a Potential of compliance: n/a  Questions asked to Determine Patient Understanding of Medication Regimen:  1. What is the name of the medication?  2. What is the medication used for?  3. When should it be taken?  4. How much should be taken?  5. How will you take it?  6. What side effects should you report?  Understanding Defined as: Excellent: All questions above are correct Good: Questions 1-4 are correct Fair: Questions 1-2 are correct  Poor: 1 or none of the above questions are correct   Pharmacist comments: Med list updated from medical records.  Pt was not available at this time.  Rodney Bowman A 07/14/2016 2:24 PM

## 2016-07-14 NOTE — Addendum Note (Signed)
Encounter addended by: Ena Dawley, RPH on: 07/14/2016  2:26 PM<BR>    Actions taken: Home Medications modified, Order Reconciliation Section accessed, Sign clinical note

## 2016-07-16 ENCOUNTER — Encounter (HOSPITAL_COMMUNITY): Payer: Self-pay

## 2016-07-16 ENCOUNTER — Encounter (HOSPITAL_COMMUNITY): Payer: Medicare Other

## 2016-07-17 DIAGNOSIS — I2510 Atherosclerotic heart disease of native coronary artery without angina pectoris: Secondary | ICD-10-CM | POA: Diagnosis not present

## 2016-07-17 DIAGNOSIS — I2583 Coronary atherosclerosis due to lipid rich plaque: Secondary | ICD-10-CM | POA: Diagnosis not present

## 2016-07-17 DIAGNOSIS — N289 Disorder of kidney and ureter, unspecified: Secondary | ICD-10-CM | POA: Diagnosis not present

## 2016-07-17 DIAGNOSIS — Z952 Presence of prosthetic heart valve: Secondary | ICD-10-CM | POA: Diagnosis not present

## 2016-07-18 ENCOUNTER — Encounter (HOSPITAL_COMMUNITY)
Admission: RE | Admit: 2016-07-18 | Discharge: 2016-07-18 | Disposition: A | Discharge status: Home / Self Care | Payer: Medicare Other | Source: Ambulatory Visit | Attending: Thoracic Surgery (Cardiothoracic Vascular Surgery) | Admitting: Thoracic Surgery (Cardiothoracic Vascular Surgery)

## 2016-07-18 DIAGNOSIS — Z952 Presence of prosthetic heart valve: Secondary | ICD-10-CM | POA: Diagnosis not present

## 2016-07-18 DIAGNOSIS — Z954 Presence of other heart-valve replacement: Secondary | ICD-10-CM | POA: Diagnosis not present

## 2016-07-18 NOTE — Progress Notes (Signed)
Daily Session Note  Patient Details  Name: Rodney Bowman MRN: [id number] Date of Birth: 31-Jan-1922 Referring Provider:     CARDIAC REHAB PHASE II ORIENTATION from 07/11/2016 in [place]  Referring Provider  Dr. Ysidro Evert      Encounter Date: 07/18/2016  Check In:     Session Check In - 07/18/16 1100      Check-In   Location [facility]   Staff Present Aundra Dubin, RN, BSN;Araiya Tilmon Luther Parody, BS, EP, Exercise Physiologist   Supervising physician immediately available to respond to emergencies See telemetry face sheet for immediately available MD   Medication changes reported     No   Fall or balance concerns reported    No   Warm-up and Cool-down Performed as group-led instruction   Resistance Training Performed Yes   VAD Patient? No     Pain Assessment   Currently in Pain? No/denies   Pain Score 0-No pain   Multiple Pain Sites No      Capillary Blood Glucose: No results found for this or any previous visit (from the past 24 hour(s)).    History  Smoking Status  . Former Smoker  . Packs/day: 1.00  . Years: 25.00  . Types: Cigarettes  . Start date: 02/18/1940  . Quit date: 12/09/1964  Smokeless Tobacco  . Never Used    Goals Met:  Independence with exercise equipment Exercise tolerated well No report of cardiac concerns or symptoms Strength training completed today  Goals Unmet:  Not Applicable  Comments: Check out 1200   Dr. Kate Sable is Medical Director for [place] and Pulmonary Rehab.

## 2016-07-21 ENCOUNTER — Encounter (HOSPITAL_COMMUNITY): Payer: Medicare Other

## 2016-07-23 ENCOUNTER — Encounter (HOSPITAL_COMMUNITY)
Admission: RE | Admit: 2016-07-23 | Discharge: 2016-07-23 | Disposition: A | Discharge status: Home / Self Care | Payer: Medicare Other | Source: Ambulatory Visit | Attending: Thoracic Surgery (Cardiothoracic Vascular Surgery) | Admitting: Thoracic Surgery (Cardiothoracic Vascular Surgery)

## 2016-07-23 DIAGNOSIS — Z952 Presence of prosthetic heart valve: Secondary | ICD-10-CM

## 2016-07-23 DIAGNOSIS — Z954 Presence of other heart-valve replacement: Secondary | ICD-10-CM | POA: Diagnosis not present

## 2016-07-23 NOTE — Progress Notes (Signed)
Daily Session Note  Patient Details  Name: LEONARD FEIGEL MRN: [id number] Date of Birth: 06/15/1921 Referring Provider:     CARDIAC REHAB PHASE II ORIENTATION from 07/11/2016 in [place]  Referring Provider  Dr. Ysidro Evert      Encounter Date: 07/23/2016  Check In:     Session Check In - 07/23/16 1100      Check-In   Location [facility]   Staff Present Aundra Dubin, RN, BSN;Keiran Sias Luther Parody, BS, EP, Exercise Physiologist   Supervising physician immediately available to respond to emergencies See telemetry face sheet for immediately available MD   Medication changes reported     No   Fall or balance concerns reported    No   Warm-up and Cool-down Performed as group-led instruction   Resistance Training Performed Yes   VAD Patient? No     Pain Assessment   Currently in Pain? No/denies   Pain Score 0-No pain   Multiple Pain Sites No      Capillary Blood Glucose: No results found for this or any previous visit (from the past 24 hour(s)).    History  Smoking Status  . Former Smoker  . Packs/day: 1.00  . Years: 25.00  . Types: Cigarettes  . Start date: 02/18/1940  . Quit date: 12/09/1964  Smokeless Tobacco  . Never Used    Goals Met:  Independence with exercise equipment Exercise tolerated well No report of cardiac concerns or symptoms Strength training completed today  Goals Unmet:  Not Applicable  Comments: Check out 1200   Dr. Kate Sable is Medical Director for [place] and Pulmonary Rehab.

## 2016-07-25 ENCOUNTER — Encounter (HOSPITAL_COMMUNITY): Payer: Medicare Other

## 2016-07-25 DIAGNOSIS — K31811 Angiodysplasia of stomach and duodenum with bleeding: Secondary | ICD-10-CM | POA: Diagnosis present

## 2016-07-25 DIAGNOSIS — Z952 Presence of prosthetic heart valve: Secondary | ICD-10-CM | POA: Diagnosis not present

## 2016-07-25 DIAGNOSIS — I350 Nonrheumatic aortic (valve) stenosis: Secondary | ICD-10-CM | POA: Diagnosis not present

## 2016-07-25 DIAGNOSIS — I5032 Chronic diastolic (congestive) heart failure: Secondary | ICD-10-CM | POA: Diagnosis present

## 2016-07-25 DIAGNOSIS — K222 Esophageal obstruction: Secondary | ICD-10-CM | POA: Diagnosis present

## 2016-07-25 DIAGNOSIS — L949 Localized connective tissue disorder, unspecified: Secondary | ICD-10-CM | POA: Diagnosis not present

## 2016-07-25 DIAGNOSIS — I25110 Atherosclerotic heart disease of native coronary artery with unstable angina pectoris: Secondary | ICD-10-CM | POA: Diagnosis not present

## 2016-07-25 DIAGNOSIS — Z7902 Long term (current) use of antithrombotics/antiplatelets: Secondary | ICD-10-CM | POA: Diagnosis not present

## 2016-07-25 DIAGNOSIS — R278 Other lack of coordination: Secondary | ICD-10-CM | POA: Diagnosis not present

## 2016-07-25 DIAGNOSIS — R404 Transient alteration of awareness: Secondary | ICD-10-CM | POA: Diagnosis not present

## 2016-07-25 DIAGNOSIS — E039 Hypothyroidism, unspecified: Secondary | ICD-10-CM | POA: Diagnosis present

## 2016-07-25 DIAGNOSIS — I2510 Atherosclerotic heart disease of native coronary artery without angina pectoris: Secondary | ICD-10-CM | POA: Diagnosis present

## 2016-07-25 DIAGNOSIS — R9431 Abnormal electrocardiogram [ECG] [EKG]: Secondary | ICD-10-CM | POA: Diagnosis not present

## 2016-07-25 DIAGNOSIS — I714 Abdominal aortic aneurysm, without rupture: Secondary | ICD-10-CM | POA: Diagnosis present

## 2016-07-25 DIAGNOSIS — J9611 Chronic respiratory failure with hypoxia: Secondary | ICD-10-CM | POA: Diagnosis present

## 2016-07-25 DIAGNOSIS — E785 Hyperlipidemia, unspecified: Secondary | ICD-10-CM | POA: Diagnosis present

## 2016-07-25 DIAGNOSIS — R109 Unspecified abdominal pain: Secondary | ICD-10-CM | POA: Diagnosis not present

## 2016-07-25 DIAGNOSIS — I248 Other forms of acute ischemic heart disease: Secondary | ICD-10-CM | POA: Diagnosis not present

## 2016-07-25 DIAGNOSIS — K922 Gastrointestinal hemorrhage, unspecified: Secondary | ICD-10-CM | POA: Diagnosis not present

## 2016-07-25 DIAGNOSIS — J449 Chronic obstructive pulmonary disease, unspecified: Secondary | ICD-10-CM | POA: Diagnosis present

## 2016-07-25 DIAGNOSIS — Z87891 Personal history of nicotine dependence: Secondary | ICD-10-CM | POA: Diagnosis not present

## 2016-07-25 DIAGNOSIS — K254 Chronic or unspecified gastric ulcer with hemorrhage: Secondary | ICD-10-CM | POA: Diagnosis not present

## 2016-07-25 DIAGNOSIS — R55 Syncope and collapse: Secondary | ICD-10-CM | POA: Diagnosis not present

## 2016-07-25 DIAGNOSIS — K921 Melena: Secondary | ICD-10-CM | POA: Diagnosis not present

## 2016-07-25 DIAGNOSIS — K219 Gastro-esophageal reflux disease without esophagitis: Secondary | ICD-10-CM | POA: Diagnosis present

## 2016-07-25 DIAGNOSIS — K5521 Angiodysplasia of colon with hemorrhage: Secondary | ICD-10-CM | POA: Diagnosis not present

## 2016-07-25 DIAGNOSIS — Z9981 Dependence on supplemental oxygen: Secondary | ICD-10-CM | POA: Diagnosis not present

## 2016-07-25 DIAGNOSIS — E079 Disorder of thyroid, unspecified: Secondary | ICD-10-CM | POA: Diagnosis present

## 2016-07-25 DIAGNOSIS — R1111 Vomiting without nausea: Secondary | ICD-10-CM | POA: Diagnosis not present

## 2016-07-25 DIAGNOSIS — E038 Other specified hypothyroidism: Secondary | ICD-10-CM | POA: Diagnosis not present

## 2016-07-25 DIAGNOSIS — R1110 Vomiting, unspecified: Secondary | ICD-10-CM | POA: Diagnosis not present

## 2016-07-25 DIAGNOSIS — R197 Diarrhea, unspecified: Secondary | ICD-10-CM | POA: Diagnosis not present

## 2016-07-25 DIAGNOSIS — D72829 Elevated white blood cell count, unspecified: Secondary | ICD-10-CM | POA: Diagnosis not present

## 2016-07-25 DIAGNOSIS — Q2733 Arteriovenous malformation of digestive system vessel: Secondary | ICD-10-CM | POA: Diagnosis not present

## 2016-07-25 DIAGNOSIS — R1011 Right upper quadrant pain: Secondary | ICD-10-CM | POA: Diagnosis not present

## 2016-07-25 DIAGNOSIS — Z7982 Long term (current) use of aspirin: Secondary | ICD-10-CM | POA: Diagnosis not present

## 2016-07-25 DIAGNOSIS — N189 Chronic kidney disease, unspecified: Secondary | ICD-10-CM | POA: Diagnosis present

## 2016-07-25 DIAGNOSIS — M6281 Muscle weakness (generalized): Secondary | ICD-10-CM | POA: Diagnosis not present

## 2016-07-25 DIAGNOSIS — R531 Weakness: Secondary | ICD-10-CM | POA: Diagnosis not present

## 2016-07-25 DIAGNOSIS — R42 Dizziness and giddiness: Secondary | ICD-10-CM | POA: Diagnosis not present

## 2016-07-25 DIAGNOSIS — D62 Acute posthemorrhagic anemia: Secondary | ICD-10-CM | POA: Diagnosis present

## 2016-07-25 DIAGNOSIS — Z7951 Long term (current) use of inhaled steroids: Secondary | ICD-10-CM | POA: Diagnosis not present

## 2016-07-25 DIAGNOSIS — J849 Interstitial pulmonary disease, unspecified: Secondary | ICD-10-CM | POA: Diagnosis not present

## 2016-07-25 DIAGNOSIS — D649 Anemia, unspecified: Secondary | ICD-10-CM | POA: Diagnosis not present

## 2016-07-25 DIAGNOSIS — I21A1 Myocardial infarction type 2: Secondary | ICD-10-CM | POA: Diagnosis present

## 2016-07-25 DIAGNOSIS — H9190 Unspecified hearing loss, unspecified ear: Secondary | ICD-10-CM | POA: Diagnosis present

## 2016-07-25 DIAGNOSIS — Z79899 Other long term (current) drug therapy: Secondary | ICD-10-CM | POA: Diagnosis not present

## 2016-07-25 DIAGNOSIS — R911 Solitary pulmonary nodule: Secondary | ICD-10-CM | POA: Diagnosis present

## 2016-07-25 DIAGNOSIS — J8410 Pulmonary fibrosis, unspecified: Secondary | ICD-10-CM | POA: Diagnosis present

## 2016-07-25 DIAGNOSIS — Z9225 Personal history of immunosupression therapy: Secondary | ICD-10-CM | POA: Diagnosis not present

## 2016-07-28 ENCOUNTER — Encounter (HOSPITAL_COMMUNITY): Payer: Medicare Other

## 2016-07-30 ENCOUNTER — Encounter (HOSPITAL_COMMUNITY)
Admission: RE | Admit: 2016-07-30 | Discharge: 2016-07-30 | Disposition: A | Discharge status: Home / Self Care | Payer: Medicare Other | Source: Ambulatory Visit | Attending: Thoracic Surgery (Cardiothoracic Vascular Surgery) | Admitting: Thoracic Surgery (Cardiothoracic Vascular Surgery)

## 2016-07-30 DIAGNOSIS — Z954 Presence of other heart-valve replacement: Secondary | ICD-10-CM | POA: Insufficient documentation

## 2016-07-30 DIAGNOSIS — Z952 Presence of prosthetic heart valve: Secondary | ICD-10-CM | POA: Insufficient documentation

## 2016-07-30 NOTE — Progress Notes (Signed)
Cardiac Individual Treatment Plan  Patient Details  Name: Rodney Bowman MRN: [id number] Date of Birth: 05/24/1921 Referring Provider:     CARDIAC REHAB PHASE II ORIENTATION from 07/11/2016 in [facility]  Referring Provider  Dr. Ysidro Evert      Initial Encounter Date:    CARDIAC REHAB PHASE II ORIENTATION from 07/11/2016 in [place]  Date  07/11/16  Referring Provider  Dr. Ysidro Evert      Visit Diagnosis: S/P TAVR (transcatheter aortic valve replacement)  Patient's Home Medications on Admission:  Current Outpatient Prescriptions:  .  acetaminophen (TYLENOL) 325 MG tablet, Take 650 mg by mouth every 6 (six) hours as needed., Disp: , Rfl:  .  alfuzosin (UROXATRAL) 10 MG 24 hr tablet, Take 10 mg by mouth daily with breakfast., Disp: , Rfl:  .  alfuzosin (UROXATRAL) 10 MG 24 hr tablet, Take 10 mg by mouth daily with breakfast., Disp: , Rfl:  .  aspirin EC 81 MG tablet, Take 81 mg by mouth daily., Disp: , Rfl:  .  aspirin EC 81 MG tablet, Take 81 mg by mouth daily., Disp: , Rfl:  .  atorvastatin (LIPITOR) 80 MG tablet, Take 80 mg by mouth daily. Reported on 08/20/2015, Disp: , Rfl:  .  atorvastatin (LIPITOR) 80 MG tablet, Take 80 mg by mouth daily at 6 PM., Disp: , Rfl:  .  calcium carbonate (OSCAL) 1500 (600 Ca) MG TABS tablet, Take 1,500 mg by mouth 3 (three) times daily with meals., Disp: , Rfl:  .  clopidogrel (PLAVIX) 75 MG tablet, Take 75 mg by mouth daily., Disp: , Rfl:  .  cyanocobalamin 500 MCG tablet, Take 500 mcg by mouth daily., Disp: , Rfl:  .  esomeprazole (NEXIUM) 40 MG capsule, Take 40 mg by mouth daily at 12 noon., Disp: , Rfl:  .  fluticasone (FLONASE) 50 MCG/ACT nasal spray, Place 1 spray into both nostrils 2 (two) times daily., Disp: , Rfl:  .  fluticasone furoate-vilanterol (BREO ELLIPTA) 100-25 MCG/INH AEPB, Inhale 1 puff into the lungs daily. Reported on 08/20/2015, Disp: , Rfl:  .  Fluticasone Propionate (FLONASE ALLERGY RELIEF  NA), Place into the nose 2 (two) times daily., Disp: , Rfl:  .  levothyroxine (SYNTHROID, LEVOTHROID) 125 MCG tablet, Take 125 mcg by mouth daily before breakfast., Disp: , Rfl:  .  levothyroxine (SYNTHROID, LEVOTHROID) 125 MCG tablet, Take 125 mcg by mouth daily before breakfast., Disp: , Rfl:  .  Multiple Vitamin (MULTIVITAMIN) capsule, Take 1 capsule by mouth daily., Disp: , Rfl:  .  mycophenolate (CELLCEPT) 500 MG tablet, Take 500-1,000 mg by mouth 2 (two) times daily. *takes 1000mg  in the morning and 500mg  at night*, Disp: , Rfl:  .  mycophenolate (CELLCEPT) 500 MG tablet, Take 1,000 mg by mouth 2 (two) times daily. Takes 2 tablets in the morning and 1 tablet in the evening, Disp: , Rfl:  .  niacin (NIASPAN) 500 MG CR tablet, Take 500 mg by mouth 2 (two) times daily. , Disp: , Rfl:  .  Omega-3 Fatty Acids (FISH OIL PO), Take 2 tablets by mouth daily. , Disp: , Rfl:  .  omeprazole-sodium bicarbonate (ZEGERID) 40-1100 MG capsule, Take 1 capsule by mouth daily before breakfast. Reported on 08/20/2015, Disp: , Rfl:  .  oxybutynin (DITROPAN-XL) 5 MG 24 hr tablet, Take 5 mg by mouth at bedtime., Disp: , Rfl:  .  oxyCODONE (OXY IR/ROXICODONE) 5 MG immediate release tablet, Take 5 mg by mouth every 6 (  six) hours as needed for severe pain., Disp: , Rfl:  .  OXYGEN-HELIUM IN, 3-5 L. , Disp: , Rfl:  .  pantoprazole (PROTONIX) 40 MG tablet, Take 40 mg by mouth daily., Disp: , Rfl:  .  predniSONE (DELTASONE) 10 MG tablet, Take 10 mg by mouth daily with breakfast., Disp: , Rfl:  .  predniSONE (DELTASONE) 20 MG tablet, Take 10 mg by mouth daily with breakfast. , Disp: , Rfl:  .  risedronate (ACTONEL) 35 MG tablet, Take 35 mg by mouth every 7 (seven) days. with water on empty stomach, nothing by mouth or lie down for next 30 minutes., Disp: , Rfl:  .  risedronate (ACTONEL) 35 MG tablet, Take 35 mg by mouth every 7 (seven) days. with water on empty stomach, nothing by mouth or lie down for next 30 minutes., Disp:  , Rfl:  .  sennosides-docusate sodium (SENOKOT-S) 8.6-50 MG tablet, Take 2 tablets by mouth 2 (two) times daily., Disp: , Rfl:  .  sulfamethoxazole-trimethoprim (BACTRIM DS,SEPTRA DS) 800-160 MG tablet, Take 1 tablet by mouth 3 (three) times a week., Disp: , Rfl:   Past Medical History: Past Medical History:  Diagnosis Date  . AAA (abdominal aortic aneurysm) ([place])    Doppler (Dr Trena Platt office 07/2010...stable 3.2)  . Carotid artery disease ([facility])    Doppler, Dr. Manuella Ghazi office, June, 2012, report scanned to this EMR, less than 50% bilateral  . Connective tissue disease ([place])    followed at [facility] interstitial lung clinic  . COPD (chronic obstructive pulmonary disease) ([place])   . Ejection fraction    Normal ejection fraction, echo, October 14, 2010,  Dr Manuella Ghazi office,, reports scan the disc E. MR  . Hyperlipidemia   . Hypothyroidism   . Interstitial lung disease ([place])    followed at [facility]  . Severe aortic stenosis 07/02/2010   Qualifier: Diagnosis of  By: Ron Parker, MD, [facility], [place]     Tobacco Use: History  Smoking Status  . Former Smoker  . Packs/day: 1.00  . Years: 25.00  . Types: Cigarettes  . Start date: 02/18/1940  . Quit date: 12/09/1964  Smokeless Tobacco  . Never Used    Labs: Recent Review Flowsheet Data    Labs for ITP Cardiac and Pulmonary Rehab Latest Ref Rng & Units 12/15/2013 12/15/2013   PHART 7.350 - 7.450 7.367 -   PCO2ART 35.0 - 45.0 mmHg 41.2 -   HCO3 20.0 - 24.0 mEq/L 23.7 24.4(H)   TCO2 0 - 100 mmol/L 25 26   ACIDBASEDEF 0.0 - 2.0 mmol/L 2.0 1.0   O2SAT % 94.0 68.0      Capillary Blood Glucose: No results found for: GLUCAP   Exercise Target Goals:    Exercise Program Goal: Individual exercise prescription set with THRR, safety & activity barriers. Participant demonstrates ability to understand and report RPE using BORG scale, to self-measure pulse accurately, and to acknowledge the importance of the exercise prescription.  Exercise Prescription  Goal: Starting with aerobic activity 30 plus minutes a day, 3 days per week for initial exercise prescription. Provide home exercise prescription and guidelines that participant acknowledges understanding prior to discharge.  Activity Barriers & Risk Stratification:     Activity Barriers & Cardiac Risk Stratification - 07/11/16 1617      Activity Barriers & Cardiac Risk Stratification   Activity Barriers Muscular Weakness  Hard of Hearing   Cardiac Risk Stratification High      6 Minute Walk:   Oxygen Initial Assessment:  Oxygen Initial Assessment - 07/11/16 1604      Home Oxygen   Home Oxygen Device Liquid Oxygen   Sleep Oxygen Prescription Continuous   Liters per minute 4   Liters per minute 5   Home at Rest Exercise Oxygen Prescription Continuous   Liters per minute 3     Initial 6 min Walk   Oxygen Used Continuous   Liters per minute 5   Resting Oxygen Saturation  during 6 min walk 99 %   Exercise Oxygen Saturation  during 6 min walk 92 %     Program Oxygen Prescription   Program Oxygen Prescription Continuous     Intervention   Short Term Goals To learn and exhibit compliance with exercise, home and travel O2 prescription;To Learn and understand importance of maintaining oxygen saturations>88%   Long  Term Goals Exhibits compliance with exercise, home and travel O2 prescription;Demonstrates proper use of MDI's;Exhibits proper breathing techniques, such as purse lipped breathing or other method taught during program session      Oxygen Re-Evaluation:   Oxygen Discharge (Final Oxygen Re-Evaluation):   Initial Exercise Prescription:     Initial Exercise Prescription - 07/11/16 1500      Date of Initial Exercise RX and Referring Provider   Date 07/11/16   Referring Provider Dr. Ysidro Evert     NuStep   Level 2   SPM 11   Minutes 20   METs 1.8     Arm Ergometer   Level 1.5   Watts 15   RPM 15   Minutes 15   METs 1.6     Prescription Details    Frequency (times per week) 3   Duration Progress to 30 minutes of continuous aerobic without signs/symptoms of physical distress     Intensity   THRR 40-80% of Max Heartrate 101-110-119   Ratings of Perceived Exertion 11-13   Perceived Dyspnea 0-4     Progression   Progression Continue progressive overload as per policy without signs/symptoms or physical distress.     Resistance Training   Training Prescription Yes   Weight 1   Reps 10-15      Perform Capillary Blood Glucose checks as needed.  Exercise Prescription Changes:      Exercise Prescription Changes    Row Name 07/29/16 1200             Response to Exercise   Blood Pressure (Admit) 98/50       Blood Pressure (Exercise) 102/42       Blood Pressure (Exit) 98/48       Heart Rate (Admit) 83 bpm       Heart Rate (Exercise) 89 bpm       Heart Rate (Exit) 86 bpm       Rating of Perceived Exertion (Exercise) 11       Duration Progress to 30 minutes of  aerobic without signs/symptoms of physical distress       Intensity THRR unchanged         Progression   Progression Continue to progress workloads to maintain intensity without signs/symptoms of physical distress.         Resistance Training   Training Prescription Yes       Weight 1       Reps 10-15         NuStep   Level 2       SPM 11       Minutes 30       METs  1.8         Home Exercise Plan   Plans to continue exercise at Home (comment)       Frequency Add 2 additional days to program exercise sessions.          Exercise Comments:      Exercise Comments    Row Name 07/29/16 1248           Exercise Comments Patient is doing well. He is staying on Nustep for both times           Exercise Goals and Review:      Exercise Goals    Row Name 07/11/16 1618             Exercise Goals   Increase Physical Activity Yes       Intervention Provide advice, education, support and counseling about physical activity/exercise needs.;Develop  an individualized exercise prescription for aerobic and resistive training based on initial evaluation findings, risk stratification, comorbidities and participant's personal goals.       Expected Outcomes Achievement of increased cardiorespiratory fitness and enhanced flexibility, muscular endurance and strength shown through measurements of functional capacity and personal statement of participant.       Increase Strength and Stamina Yes       Intervention Provide advice, education, support and counseling about physical activity/exercise needs.;Develop an individualized exercise prescription for aerobic and resistive training based on initial evaluation findings, risk stratification, comorbidities and participant's personal goals.       Expected Outcomes Achievement of increased cardiorespiratory fitness and enhanced flexibility, muscular endurance and strength shown through measurements of functional capacity and personal statement of participant.          Exercise Goals Re-Evaluation :    Discharge Exercise Prescription (Final Exercise Prescription Changes):     Exercise Prescription Changes - 07/29/16 1200      Response to Exercise   Blood Pressure (Admit) 98/50   Blood Pressure (Exercise) 102/42   Blood Pressure (Exit) 98/48   Heart Rate (Admit) 83 bpm   Heart Rate (Exercise) 89 bpm   Heart Rate (Exit) 86 bpm   Rating of Perceived Exertion (Exercise) 11   Duration Progress to 30 minutes of  aerobic without signs/symptoms of physical distress   Intensity THRR unchanged     Progression   Progression Continue to progress workloads to maintain intensity without signs/symptoms of physical distress.     Resistance Training   Training Prescription Yes   Weight 1   Reps 10-15     NuStep   Level 2   SPM 11   Minutes 30   METs 1.8     Home Exercise Plan   Plans to continue exercise at Home (comment)   Frequency Add 2 additional days to program exercise sessions.       Nutrition:  Target Goals: Understanding of nutrition guidelines, daily intake of sodium 1500mg , cholesterol 200mg , calories 30% from fat and 7% or less from saturated fats, daily to have 5 or more servings of fruits and vegetables.  Biometrics:     Pre Biometrics - 07/11/16 1514      Pre Biometrics   Height 5\' 10"  (1.778 m)   Weight 171 lb 8.3 oz (77.8 kg)   Waist Circumference 36.5 inches   Hip Circumference 41 inches   Waist to Hip Ratio 0.89 %   BMI (Calculated) 24.7   Triceps Skinfold 9 mm   % Body Fat 31.8 %   Grip Strength 65 kg  Flexibility 0 in  Patient could not do flex test   Single Leg Stand 0 seconds  Patient felt too uneasy        Nutrition Therapy Plan and Nutrition Goals:   Nutrition Discharge: Rate Your Plate Scores:     Nutrition Assessments - 07/11/16 1618      MEDFICTS Scores   Pre Score 58      Nutrition Goals Re-Evaluation:   Nutrition Goals Discharge (Final Nutrition Goals Re-Evaluation):   Psychosocial: Target Goals: Acknowledge presence or absence of significant depression and/or stress, maximize coping skills, provide positive support system. Participant is able to verbalize types and ability to use techniques and skills needed for reducing stress and depression.  Initial Review & Psychosocial Screening:     Initial Psych Review & Screening - 07/11/16 1621      Initial Review   Current issues with None Identified     Family Dynamics   Good Support System? Yes     Barriers   Psychosocial barriers to participate in program There are no identifiable barriers or psychosocial needs.     Screening Interventions   Interventions Encouraged to exercise      Quality of Life Scores:     Quality of Life - 07/11/16 1503      Quality of Life Scores   Health/Function Pre 20.04 %   Socioeconomic Pre 25.43 %   Psych/Spiritual Pre 24 %   Family Pre 24 %   GLOBAL Pre 22.62 %      PHQ-9: Recent Review Flowsheet Data     Depression screen [facility] 2/9 07/11/2016   Decreased Interest 0   Down, Depressed, Hopeless 0   PHQ - 2 Score 0   Tired, decreased energy 2   Change in appetite 3   Feeling bad or failure about yourself  0   Trouble concentrating 0   Moving slowly or fidgety/restless 0   Suicidal thoughts 0   Difficult doing work/chores Not difficult at all     Interpretation of Total Score  Total Score Depression Severity:  1-4 = Minimal depression, 5-9 = Mild depression, 10-14 = Moderate depression, 15-19 = Moderately severe depression, 20-27 = Severe depression   Psychosocial Evaluation and Intervention:     Psychosocial Evaluation - 07/11/16 1621      Psychosocial Evaluation & Interventions   Interventions Encouraged to exercise with the program and follow exercise prescription   Continue Psychosocial Services  No Follow up required      Psychosocial Re-Evaluation:   Psychosocial Discharge (Final Psychosocial Re-Evaluation):   Vocational Rehabilitation: Provide vocational rehab assistance to qualifying candidates.   Vocational Rehab Evaluation & Intervention:     Vocational Rehab - 07/11/16 1615      Initial Vocational Rehab Evaluation & Intervention   Assessment shows need for Vocational Rehabilitation No      Education: Education Goals: Education classes will be provided on a weekly basis, covering required topics. Participant will state understanding/return demonstration of topics presented.  Learning Barriers/Preferences:     Learning Barriers/Preferences - 07/11/16 1614      Learning Barriers/Preferences   Learning Barriers Hearing   Learning Preferences Individual Instruction;Group Instruction      Education Topics: Hypertension, Hypertension Reduction -Define heart disease and high blood pressure. Discus how high blood pressure affects the body and ways to reduce high blood pressure.   Exercise and Your Heart -Discuss why it is important to exercise, the FITT  principles of exercise, normal and abnormal responses to exercise,  and how to exercise safely.   Angina -Discuss definition of angina, causes of angina, treatment of angina, and how to decrease risk of having angina.   Cardiac Medications -Review what the following cardiac medications are used for, how they affect the body, and side effects that may occur when taking the medications.  Medications include Aspirin, Beta blockers, calcium channel blockers, ACE Inhibitors, angiotensin receptor blockers, diuretics, digoxin, and antihyperlipidemics.   Congestive Heart Failure -Discuss the definition of CHF, how to live with CHF, the signs and symptoms of CHF, and how keep track of weight and sodium intake.   Heart Disease and Intimacy -Discus the effect sexual activity has on the heart, how changes occur during intimacy as we age, and safety during sexual activity.   Smoking Cessation / COPD -Discuss different methods to quit smoking, the health benefits of quitting smoking, and the definition of COPD.   Nutrition I: Fats -Discuss the types of cholesterol, what cholesterol does to the heart, and how cholesterol levels can be controlled.   Nutrition II: Labels -Discuss the different components of food labels and how to read food label   CARDIAC REHAB PHASE II EXERCISE from 07/23/2016 in [place]  Date  07/23/16  Educator  DC  Instruction Review Code  2- meets goals/outcomes      Heart Parts and Heart Disease -Discuss the anatomy of the heart, the pathway of blood circulation through the heart, and these are affected by heart disease.   Stress I: Signs and Symptoms -Discuss the causes of stress, how stress may lead to anxiety and depression, and ways to limit stress.   Stress II: Relaxation -Discuss different types of relaxation techniques to limit stress.   Warning Signs of Stroke / TIA -Discuss definition of a stroke, what the signs and symptoms are  of a stroke, and how to identify when someone is having stroke.   Knowledge Questionnaire Score:     Knowledge Questionnaire Score - 07/11/16 1615      Knowledge Questionnaire Score   Pre Score 19/24      Core Components/Risk Factors/Patient Goals at Admission:     Personal Goals and Risk Factors at Admission - 07/11/16 1619      Core Components/Risk Factors/Patient Goals on Admission    Weight Management Weight Maintenance   Improve shortness of breath with ADL's Yes   Intervention Provide education, individualized exercise plan and daily activity instruction to help decrease symptoms of SOB with activities of daily living.   Expected Outcomes Short Term: Achieves a reduction of symptoms when performing activities of daily living.   Personal Goal Other Yes   Personal Goal Increase heart muscle strength, gain physical strength   Intervention Attend CR 3 x week and supplement exercise at home 2 x week   Expected Outcomes To reach personal goals.       Core Components/Risk Factors/Patient Goals Review:      Goals and Risk Factor Review    Row Name 07/11/16 1620             Core Components/Risk Factors/Patient Goals Review   Personal Goals Review Improve shortness of breath with ADL's  Increase heart muscle strength          Core Components/Risk Factors/Patient Goals at Discharge (Final Review):      Goals and Risk Factor Review - 07/11/16 1620      Core Components/Risk Factors/Patient Goals Review   Personal Goals Review Improve shortness of breath with ADL's  Increase heart muscle strength      ITP Comments:     ITP Comments    Row Name 07/30/16 1312           ITP Comments Patient new to the program completing 3 sessions with some progression. He has not attended since 07/23/16. Will continue to monitor.           Comments: ITP 30 Day REVIEW Patient new to the program completing 3 sessions with some progression. He has not attended since 07/23/16.  Will continue to monitor.

## 2016-08-01 ENCOUNTER — Encounter (HOSPITAL_COMMUNITY): Payer: Medicare Other

## 2016-08-01 DIAGNOSIS — D518 Other vitamin B12 deficiency anemias: Secondary | ICD-10-CM | POA: Diagnosis not present

## 2016-08-01 DIAGNOSIS — Z79899 Other long term (current) drug therapy: Secondary | ICD-10-CM | POA: Diagnosis not present

## 2016-08-01 DIAGNOSIS — J849 Interstitial pulmonary disease, unspecified: Secondary | ICD-10-CM | POA: Diagnosis not present

## 2016-08-01 DIAGNOSIS — I5032 Chronic diastolic (congestive) heart failure: Secondary | ICD-10-CM | POA: Diagnosis not present

## 2016-08-01 DIAGNOSIS — E119 Type 2 diabetes mellitus without complications: Secondary | ICD-10-CM | POA: Diagnosis not present

## 2016-08-01 DIAGNOSIS — E782 Mixed hyperlipidemia: Secondary | ICD-10-CM | POA: Diagnosis not present

## 2016-08-01 DIAGNOSIS — J8410 Pulmonary fibrosis, unspecified: Secondary | ICD-10-CM | POA: Diagnosis not present

## 2016-08-01 DIAGNOSIS — K219 Gastro-esophageal reflux disease without esophagitis: Secondary | ICD-10-CM | POA: Diagnosis not present

## 2016-08-01 DIAGNOSIS — I714 Abdominal aortic aneurysm, without rupture: Secondary | ICD-10-CM | POA: Diagnosis not present

## 2016-08-01 DIAGNOSIS — K254 Chronic or unspecified gastric ulcer with hemorrhage: Secondary | ICD-10-CM | POA: Diagnosis not present

## 2016-08-01 DIAGNOSIS — I25110 Atherosclerotic heart disease of native coronary artery with unstable angina pectoris: Secondary | ICD-10-CM | POA: Diagnosis not present

## 2016-08-01 DIAGNOSIS — M6281 Muscle weakness (generalized): Secondary | ICD-10-CM | POA: Diagnosis not present

## 2016-08-01 DIAGNOSIS — E785 Hyperlipidemia, unspecified: Secondary | ICD-10-CM | POA: Diagnosis not present

## 2016-08-01 DIAGNOSIS — K31811 Angiodysplasia of stomach and duodenum with bleeding: Secondary | ICD-10-CM | POA: Diagnosis not present

## 2016-08-01 DIAGNOSIS — J449 Chronic obstructive pulmonary disease, unspecified: Secondary | ICD-10-CM | POA: Diagnosis not present

## 2016-08-01 DIAGNOSIS — R278 Other lack of coordination: Secondary | ICD-10-CM | POA: Diagnosis not present

## 2016-08-01 DIAGNOSIS — D62 Acute posthemorrhagic anemia: Secondary | ICD-10-CM | POA: Diagnosis not present

## 2016-08-01 DIAGNOSIS — E038 Other specified hypothyroidism: Secondary | ICD-10-CM | POA: Diagnosis not present

## 2016-08-01 DIAGNOSIS — Z9225 Personal history of immunosupression therapy: Secondary | ICD-10-CM | POA: Diagnosis not present

## 2016-08-04 ENCOUNTER — Encounter (HOSPITAL_COMMUNITY): Payer: Medicare Other

## 2016-08-06 ENCOUNTER — Encounter (HOSPITAL_COMMUNITY): Payer: Medicare Other

## 2016-08-08 ENCOUNTER — Encounter (HOSPITAL_COMMUNITY): Payer: Medicare Other

## 2016-08-08 DIAGNOSIS — J449 Chronic obstructive pulmonary disease, unspecified: Secondary | ICD-10-CM | POA: Diagnosis not present

## 2016-08-08 DIAGNOSIS — I25110 Atherosclerotic heart disease of native coronary artery with unstable angina pectoris: Secondary | ICD-10-CM | POA: Diagnosis not present

## 2016-08-08 DIAGNOSIS — J849 Interstitial pulmonary disease, unspecified: Secondary | ICD-10-CM | POA: Diagnosis not present

## 2016-08-08 DIAGNOSIS — E785 Hyperlipidemia, unspecified: Secondary | ICD-10-CM | POA: Diagnosis not present

## 2016-08-11 ENCOUNTER — Encounter (HOSPITAL_COMMUNITY): Payer: Medicare Other

## 2016-08-11 DIAGNOSIS — E038 Other specified hypothyroidism: Secondary | ICD-10-CM | POA: Diagnosis not present

## 2016-08-11 DIAGNOSIS — E782 Mixed hyperlipidemia: Secondary | ICD-10-CM | POA: Diagnosis not present

## 2016-08-11 DIAGNOSIS — Z79899 Other long term (current) drug therapy: Secondary | ICD-10-CM | POA: Diagnosis not present

## 2016-08-11 DIAGNOSIS — D518 Other vitamin B12 deficiency anemias: Secondary | ICD-10-CM | POA: Diagnosis not present

## 2016-08-11 DIAGNOSIS — E119 Type 2 diabetes mellitus without complications: Secondary | ICD-10-CM | POA: Diagnosis not present

## 2016-08-12 DIAGNOSIS — I2510 Atherosclerotic heart disease of native coronary artery without angina pectoris: Secondary | ICD-10-CM | POA: Diagnosis not present

## 2016-08-12 DIAGNOSIS — Z9981 Dependence on supplemental oxygen: Secondary | ICD-10-CM | POA: Diagnosis not present

## 2016-08-12 DIAGNOSIS — I951 Orthostatic hypotension: Secondary | ICD-10-CM | POA: Diagnosis not present

## 2016-08-12 DIAGNOSIS — K219 Gastro-esophageal reflux disease without esophagitis: Secondary | ICD-10-CM | POA: Diagnosis not present

## 2016-08-12 DIAGNOSIS — J449 Chronic obstructive pulmonary disease, unspecified: Secondary | ICD-10-CM | POA: Diagnosis not present

## 2016-08-12 DIAGNOSIS — M6281 Muscle weakness (generalized): Secondary | ICD-10-CM | POA: Diagnosis not present

## 2016-08-12 DIAGNOSIS — E785 Hyperlipidemia, unspecified: Secondary | ICD-10-CM | POA: Diagnosis not present

## 2016-08-13 ENCOUNTER — Encounter (HOSPITAL_COMMUNITY): Payer: Medicare Other

## 2016-08-14 DIAGNOSIS — J449 Chronic obstructive pulmonary disease, unspecified: Secondary | ICD-10-CM | POA: Diagnosis not present

## 2016-08-14 DIAGNOSIS — M6281 Muscle weakness (generalized): Secondary | ICD-10-CM | POA: Diagnosis not present

## 2016-08-14 DIAGNOSIS — K219 Gastro-esophageal reflux disease without esophagitis: Secondary | ICD-10-CM | POA: Diagnosis not present

## 2016-08-14 DIAGNOSIS — I951 Orthostatic hypotension: Secondary | ICD-10-CM | POA: Diagnosis not present

## 2016-08-14 DIAGNOSIS — I2510 Atherosclerotic heart disease of native coronary artery without angina pectoris: Secondary | ICD-10-CM | POA: Diagnosis not present

## 2016-08-14 DIAGNOSIS — E785 Hyperlipidemia, unspecified: Secondary | ICD-10-CM | POA: Diagnosis not present

## 2016-08-15 ENCOUNTER — Encounter (HOSPITAL_COMMUNITY): Payer: Medicare Other

## 2016-08-15 DIAGNOSIS — K219 Gastro-esophageal reflux disease without esophagitis: Secondary | ICD-10-CM | POA: Diagnosis not present

## 2016-08-15 DIAGNOSIS — E785 Hyperlipidemia, unspecified: Secondary | ICD-10-CM | POA: Diagnosis not present

## 2016-08-15 DIAGNOSIS — I2510 Atherosclerotic heart disease of native coronary artery without angina pectoris: Secondary | ICD-10-CM | POA: Diagnosis not present

## 2016-08-15 DIAGNOSIS — I951 Orthostatic hypotension: Secondary | ICD-10-CM | POA: Diagnosis not present

## 2016-08-15 DIAGNOSIS — M6281 Muscle weakness (generalized): Secondary | ICD-10-CM | POA: Diagnosis not present

## 2016-08-15 DIAGNOSIS — J449 Chronic obstructive pulmonary disease, unspecified: Secondary | ICD-10-CM | POA: Diagnosis not present

## 2016-08-18 ENCOUNTER — Encounter (HOSPITAL_COMMUNITY): Payer: Medicare Other

## 2016-08-18 DIAGNOSIS — M6281 Muscle weakness (generalized): Secondary | ICD-10-CM | POA: Diagnosis not present

## 2016-08-18 DIAGNOSIS — K219 Gastro-esophageal reflux disease without esophagitis: Secondary | ICD-10-CM | POA: Diagnosis not present

## 2016-08-18 DIAGNOSIS — I951 Orthostatic hypotension: Secondary | ICD-10-CM | POA: Diagnosis not present

## 2016-08-18 DIAGNOSIS — I2510 Atherosclerotic heart disease of native coronary artery without angina pectoris: Secondary | ICD-10-CM | POA: Diagnosis not present

## 2016-08-18 DIAGNOSIS — J449 Chronic obstructive pulmonary disease, unspecified: Secondary | ICD-10-CM | POA: Diagnosis not present

## 2016-08-18 DIAGNOSIS — E785 Hyperlipidemia, unspecified: Secondary | ICD-10-CM | POA: Diagnosis not present

## 2016-08-18 NOTE — Progress Notes (Signed)
Cardiac Individual Treatment Plan  Patient Details  Name: Rodney Bowman MRN: [id number] Date of Birth: 1921-06-01 Referring Provider:     CARDIAC REHAB PHASE II ORIENTATION from 07/11/2016 in [place]  Referring Provider  Dr. Ysidro Evert      Initial Encounter Date:    CARDIAC REHAB PHASE II ORIENTATION from 07/11/2016 in [place]  Date  07/11/16  Referring Provider  Dr. Ysidro Evert      Visit Diagnosis: S/P TAVR (transcatheter aortic valve replacement)  Patient's Home Medications on Admission:  Current Outpatient Prescriptions:  .  acetaminophen (TYLENOL) 325 MG tablet, Take 650 mg by mouth every 6 (six) hours as needed., Disp: , Rfl:  .  alfuzosin (UROXATRAL) 10 MG 24 hr tablet, Take 10 mg by mouth daily with breakfast., Disp: , Rfl:  .  alfuzosin (UROXATRAL) 10 MG 24 hr tablet, Take 10 mg by mouth daily with breakfast., Disp: , Rfl:  .  aspirin EC 81 MG tablet, Take 81 mg by mouth daily., Disp: , Rfl:  .  aspirin EC 81 MG tablet, Take 81 mg by mouth daily., Disp: , Rfl:  .  atorvastatin (LIPITOR) 80 MG tablet, Take 80 mg by mouth daily. Reported on 08/20/2015, Disp: , Rfl:  .  atorvastatin (LIPITOR) 80 MG tablet, Take 80 mg by mouth daily at 6 PM., Disp: , Rfl:  .  calcium carbonate (OSCAL) 1500 (600 Ca) MG TABS tablet, Take 1,500 mg by mouth 3 (three) times daily with meals., Disp: , Rfl:  .  clopidogrel (PLAVIX) 75 MG tablet, Take 75 mg by mouth daily., Disp: , Rfl:  .  cyanocobalamin 500 MCG tablet, Take 500 mcg by mouth daily., Disp: , Rfl:  .  esomeprazole (NEXIUM) 40 MG capsule, Take 40 mg by mouth daily at 12 noon., Disp: , Rfl:  .  fluticasone (FLONASE) 50 MCG/ACT nasal spray, Place 1 spray into both nostrils 2 (two) times daily., Disp: , Rfl:  .  fluticasone furoate-vilanterol (BREO ELLIPTA) 100-25 MCG/INH AEPB, Inhale 1 puff into the lungs daily. Reported on 08/20/2015, Disp: , Rfl:  .  Fluticasone Propionate (FLONASE ALLERGY RELIEF  NA), Place into the nose 2 (two) times daily., Disp: , Rfl:  .  levothyroxine (SYNTHROID, LEVOTHROID) 125 MCG tablet, Take 125 mcg by mouth daily before breakfast., Disp: , Rfl:  .  levothyroxine (SYNTHROID, LEVOTHROID) 125 MCG tablet, Take 125 mcg by mouth daily before breakfast., Disp: , Rfl:  .  Multiple Vitamin (MULTIVITAMIN) capsule, Take 1 capsule by mouth daily., Disp: , Rfl:  .  mycophenolate (CELLCEPT) 500 MG tablet, Take 500-1,000 mg by mouth 2 (two) times daily. *takes 1000mg  in the morning and 500mg  at night*, Disp: , Rfl:  .  mycophenolate (CELLCEPT) 500 MG tablet, Take 1,000 mg by mouth 2 (two) times daily. Takes 2 tablets in the morning and 1 tablet in the evening, Disp: , Rfl:  .  niacin (NIASPAN) 500 MG CR tablet, Take 500 mg by mouth 2 (two) times daily. , Disp: , Rfl:  .  Omega-3 Fatty Acids (FISH OIL PO), Take 2 tablets by mouth daily. , Disp: , Rfl:  .  omeprazole-sodium bicarbonate (ZEGERID) 40-1100 MG capsule, Take 1 capsule by mouth daily before breakfast. Reported on 08/20/2015, Disp: , Rfl:  .  oxybutynin (DITROPAN-XL) 5 MG 24 hr tablet, Take 5 mg by mouth at bedtime., Disp: , Rfl:  .  oxyCODONE (OXY IR/ROXICODONE) 5 MG immediate release tablet, Take 5 mg by mouth every 6 (  six) hours as needed for severe pain., Disp: , Rfl:  .  OXYGEN-HELIUM IN, 3-5 L. , Disp: , Rfl:  .  pantoprazole (PROTONIX) 40 MG tablet, Take 40 mg by mouth daily., Disp: , Rfl:  .  predniSONE (DELTASONE) 10 MG tablet, Take 10 mg by mouth daily with breakfast., Disp: , Rfl:  .  predniSONE (DELTASONE) 20 MG tablet, Take 10 mg by mouth daily with breakfast. , Disp: , Rfl:  .  risedronate (ACTONEL) 35 MG tablet, Take 35 mg by mouth every 7 (seven) days. with water on empty stomach, nothing by mouth or lie down for next 30 minutes., Disp: , Rfl:  .  risedronate (ACTONEL) 35 MG tablet, Take 35 mg by mouth every 7 (seven) days. with water on empty stomach, nothing by mouth or lie down for next 30 minutes., Disp:  , Rfl:  .  sennosides-docusate sodium (SENOKOT-S) 8.6-50 MG tablet, Take 2 tablets by mouth 2 (two) times daily., Disp: , Rfl:  .  sulfamethoxazole-trimethoprim (BACTRIM DS,SEPTRA DS) 800-160 MG tablet, Take 1 tablet by mouth 3 (three) times a week., Disp: , Rfl:   Past Medical History: Past Medical History:  Diagnosis Date  . AAA (abdominal aortic aneurysm) ([place])    Doppler (Dr Trena Platt office 07/2010...stable 3.2)  . Carotid artery disease ([facility])    Doppler, Dr. Manuella Ghazi office, June, 2012, report scanned to this EMR, less than 50% bilateral  . Connective tissue disease ([place])    followed at [facility] interstitial lung clinic  . COPD (chronic obstructive pulmonary disease) ([place])   . Ejection fraction    Normal ejection fraction, echo, October 14, 2010,  Dr Manuella Ghazi office,, reports scan the disc E. MR  . Hyperlipidemia   . Hypothyroidism   . Interstitial lung disease ([place])    followed at [facility]  . Severe aortic stenosis 07/02/2010   Qualifier: Diagnosis of  By: Ron Parker, MD, [facility], [place]     Tobacco Use: History  Smoking Status  . Former Smoker  . Packs/day: 1.00  . Years: 25.00  . Types: Cigarettes  . Start date: 02/18/1940  . Quit date: 12/09/1964  Smokeless Tobacco  . Never Used    Labs: Recent Review Flowsheet Data    Labs for ITP Cardiac and Pulmonary Rehab Latest Ref Rng & Units 12/15/2013 12/15/2013   PHART 7.350 - 7.450 7.367 -   PCO2ART 35.0 - 45.0 mmHg 41.2 -   HCO3 20.0 - 24.0 mEq/L 23.7 24.4(H)   TCO2 0 - 100 mmol/L 25 26   ACIDBASEDEF 0.0 - 2.0 mmol/L 2.0 1.0   O2SAT % 94.0 68.0      Capillary Blood Glucose: No results found for: GLUCAP   Exercise Target Goals:    Exercise Program Goal: Individual exercise prescription set with THRR, safety & activity barriers. Participant demonstrates ability to understand and report RPE using BORG scale, to self-measure pulse accurately, and to acknowledge the importance of the exercise prescription.  Exercise Prescription  Goal: Starting with aerobic activity 30 plus minutes a day, 3 days per week for initial exercise prescription. Provide home exercise prescription and guidelines that participant acknowledges understanding prior to discharge.  Activity Barriers & Risk Stratification:     Activity Barriers & Cardiac Risk Stratification - 07/11/16 1617      Activity Barriers & Cardiac Risk Stratification   Activity Barriers Muscular Weakness  Hard of Hearing   Cardiac Risk Stratification High      6 Minute Walk:   Oxygen Initial Assessment:  Oxygen Initial Assessment - 07/11/16 1604      Home Oxygen   Home Oxygen Device Liquid Oxygen   Sleep Oxygen Prescription Continuous   Liters per minute 4   Liters per minute 5   Home at Rest Exercise Oxygen Prescription Continuous   Liters per minute 3     Initial 6 min Walk   Oxygen Used Continuous   Liters per minute 5   Resting Oxygen Saturation  during 6 min walk 99 %   Exercise Oxygen Saturation  during 6 min walk 92 %     Program Oxygen Prescription   Program Oxygen Prescription Continuous     Intervention   Short Term Goals To learn and exhibit compliance with exercise, home and travel O2 prescription;To Learn and understand importance of maintaining oxygen saturations>88%   Long  Term Goals Exhibits compliance with exercise, home and travel O2 prescription;Demonstrates proper use of MDI's;Exhibits proper breathing techniques, such as purse lipped breathing or other method taught during program session      Oxygen Re-Evaluation:   Oxygen Discharge (Final Oxygen Re-Evaluation):   Initial Exercise Prescription:     Initial Exercise Prescription - 07/11/16 1500      Date of Initial Exercise RX and Referring Provider   Date 07/11/16   Referring Provider Dr. Ysidro Evert     NuStep   Level 2   SPM 11   Minutes 20   METs 1.8     Arm Ergometer   Level 1.5   Watts 15   RPM 15   Minutes 15   METs 1.6     Prescription Details    Frequency (times per week) 3   Duration Progress to 30 minutes of continuous aerobic without signs/symptoms of physical distress     Intensity   THRR 40-80% of Max Heartrate 101-110-119   Ratings of Perceived Exertion 11-13   Perceived Dyspnea 0-4     Progression   Progression Continue progressive overload as per policy without signs/symptoms or physical distress.     Resistance Training   Training Prescription Yes   Weight 1   Reps 10-15      Perform Capillary Blood Glucose checks as needed.  Exercise Prescription Changes:      Exercise Prescription Changes    Row Name 07/29/16 1200             Response to Exercise   Blood Pressure (Admit) 98/50       Blood Pressure (Exercise) 102/42       Blood Pressure (Exit) 98/48       Heart Rate (Admit) 83 bpm       Heart Rate (Exercise) 89 bpm       Heart Rate (Exit) 86 bpm       Rating of Perceived Exertion (Exercise) 11       Duration Progress to 30 minutes of  aerobic without signs/symptoms of physical distress       Intensity THRR unchanged         Progression   Progression Continue to progress workloads to maintain intensity without signs/symptoms of physical distress.         Resistance Training   Training Prescription Yes       Weight 1       Reps 10-15         NuStep   Level 2       SPM 11       Minutes 30       METs  1.8         Home Exercise Plan   Plans to continue exercise at Home (comment)       Frequency Add 2 additional days to program exercise sessions.          Exercise Comments:      Exercise Comments    Row Name 07/29/16 1248 08/15/16 1503         Exercise Comments Patient is doing well. He is staying on Nustep for both times  Patient has not yet returned to CR since last session, 07/23/2016. No progression has been made.          Exercise Goals and Review:      Exercise Goals    Row Name 07/11/16 1618             Exercise Goals   Increase Physical Activity Yes        Intervention Provide advice, education, support and counseling about physical activity/exercise needs.;Develop an individualized exercise prescription for aerobic and resistive training based on initial evaluation findings, risk stratification, comorbidities and participant's personal goals.       Expected Outcomes Achievement of increased cardiorespiratory fitness and enhanced flexibility, muscular endurance and strength shown through measurements of functional capacity and personal statement of participant.       Increase Strength and Stamina Yes       Intervention Provide advice, education, support and counseling about physical activity/exercise needs.;Develop an individualized exercise prescription for aerobic and resistive training based on initial evaluation findings, risk stratification, comorbidities and participant's personal goals.       Expected Outcomes Achievement of increased cardiorespiratory fitness and enhanced flexibility, muscular endurance and strength shown through measurements of functional capacity and personal statement of participant.          Exercise Goals Re-Evaluation :    Discharge Exercise Prescription (Final Exercise Prescription Changes):     Exercise Prescription Changes - 07/29/16 1200      Response to Exercise   Blood Pressure (Admit) 98/50   Blood Pressure (Exercise) 102/42   Blood Pressure (Exit) 98/48   Heart Rate (Admit) 83 bpm   Heart Rate (Exercise) 89 bpm   Heart Rate (Exit) 86 bpm   Rating of Perceived Exertion (Exercise) 11   Duration Progress to 30 minutes of  aerobic without signs/symptoms of physical distress   Intensity THRR unchanged     Progression   Progression Continue to progress workloads to maintain intensity without signs/symptoms of physical distress.     Resistance Training   Training Prescription Yes   Weight 1   Reps 10-15     NuStep   Level 2   SPM 11   Minutes 30   METs 1.8     Home Exercise Plan   Plans to  continue exercise at Home (comment)   Frequency Add 2 additional days to program exercise sessions.      Nutrition:  Target Goals: Understanding of nutrition guidelines, daily intake of sodium 1500mg , cholesterol 200mg , calories 30% from fat and 7% or less from saturated fats, daily to have 5 or more servings of fruits and vegetables.  Biometrics:     Pre Biometrics - 07/11/16 1514      Pre Biometrics   Height 5\' 10"  (1.778 m)   Weight 171 lb 8.3 oz (77.8 kg)   Waist Circumference 36.5 inches   Hip Circumference 41 inches   Waist to Hip Ratio 0.89 %   BMI (Calculated) 24.7   Triceps Skinfold  9 mm   % Body Fat 31.8 %   Grip Strength 65 kg   Flexibility 0 in  Patient could not do flex test   Single Leg Stand 0 seconds  Patient felt too uneasy        Nutrition Therapy Plan and Nutrition Goals:   Nutrition Discharge: Rate Your Plate Scores:     Nutrition Assessments - 07/11/16 1618      MEDFICTS Scores   Pre Score 58      Nutrition Goals Re-Evaluation:   Nutrition Goals Discharge (Final Nutrition Goals Re-Evaluation):   Psychosocial: Target Goals: Acknowledge presence or absence of significant depression and/or stress, maximize coping skills, provide positive support system. Participant is able to verbalize types and ability to use techniques and skills needed for reducing stress and depression.  Initial Review & Psychosocial Screening:     Initial Psych Review & Screening - 07/11/16 1621      Initial Review   Current issues with None Identified     Family Dynamics   Good Support System? Yes     Barriers   Psychosocial barriers to participate in program There are no identifiable barriers or psychosocial needs.     Screening Interventions   Interventions Encouraged to exercise      Quality of Life Scores:     Quality of Life - 07/11/16 1503      Quality of Life Scores   Health/Function Pre 20.04 %   Socioeconomic Pre 25.43 %    Psych/Spiritual Pre 24 %   Family Pre 24 %   GLOBAL Pre 22.62 %      PHQ-9: Recent Review Flowsheet Data    Depression screen [facility] 2/9 07/11/2016   Decreased Interest 0   Down, Depressed, Hopeless 0   PHQ - 2 Score 0   Tired, decreased energy 2   Change in appetite 3   Feeling bad or failure about yourself  0   Trouble concentrating 0   Moving slowly or fidgety/restless 0   Suicidal thoughts 0   Difficult doing work/chores Not difficult at all     Interpretation of Total Score  Total Score Depression Severity:  1-4 = Minimal depression, 5-9 = Mild depression, 10-14 = Moderate depression, 15-19 = Moderately severe depression, 20-27 = Severe depression   Psychosocial Evaluation and Intervention:     Psychosocial Evaluation - 07/11/16 1621      Psychosocial Evaluation & Interventions   Interventions Encouraged to exercise with the program and follow exercise prescription   Continue Psychosocial Services  No Follow up required      Psychosocial Re-Evaluation:   Psychosocial Discharge (Final Psychosocial Re-Evaluation):   Vocational Rehabilitation: Provide vocational rehab assistance to qualifying candidates.   Vocational Rehab Evaluation & Intervention:     Vocational Rehab - 07/11/16 1615      Initial Vocational Rehab Evaluation & Intervention   Assessment shows need for Vocational Rehabilitation No      Education: Education Goals: Education classes will be provided on a weekly basis, covering required topics. Participant will state understanding/return demonstration of topics presented.  Learning Barriers/Preferences:     Learning Barriers/Preferences - 07/11/16 1614      Learning Barriers/Preferences   Learning Barriers Hearing   Learning Preferences Individual Instruction;Group Instruction      Education Topics: Hypertension, Hypertension Reduction -Define heart disease and high blood pressure. Discus how high blood pressure affects the body and  ways to reduce high blood pressure.   Exercise and Your Heart -Discuss  why it is important to exercise, the FITT principles of exercise, normal and abnormal responses to exercise, and how to exercise safely.   Angina -Discuss definition of angina, causes of angina, treatment of angina, and how to decrease risk of having angina.   Cardiac Medications -Review what the following cardiac medications are used for, how they affect the body, and side effects that may occur when taking the medications.  Medications include Aspirin, Beta blockers, calcium channel blockers, ACE Inhibitors, angiotensin receptor blockers, diuretics, digoxin, and antihyperlipidemics.   Congestive Heart Failure -Discuss the definition of CHF, how to live with CHF, the signs and symptoms of CHF, and how keep track of weight and sodium intake.   Heart Disease and Intimacy -Discus the effect sexual activity has on the heart, how changes occur during intimacy as we age, and safety during sexual activity.   Smoking Cessation / COPD -Discuss different methods to quit smoking, the health benefits of quitting smoking, and the definition of COPD.   Nutrition I: Fats -Discuss the types of cholesterol, what cholesterol does to the heart, and how cholesterol levels can be controlled.   Nutrition II: Labels -Discuss the different components of food labels and how to read food label   CARDIAC REHAB PHASE II EXERCISE from 07/23/2016 in [place]  Date  07/23/16  Educator  DC  Instruction Review Code  2- meets goals/outcomes      Heart Parts and Heart Disease -Discuss the anatomy of the heart, the pathway of blood circulation through the heart, and these are affected by heart disease.   Stress I: Signs and Symptoms -Discuss the causes of stress, how stress may lead to anxiety and depression, and ways to limit stress.   Stress II: Relaxation -Discuss different types of relaxation techniques  to limit stress.   Warning Signs of Stroke / TIA -Discuss definition of a stroke, what the signs and symptoms are of a stroke, and how to identify when someone is having stroke.   Knowledge Questionnaire Score:     Knowledge Questionnaire Score - 07/11/16 1615      Knowledge Questionnaire Score   Pre Score 19/24      Core Components/Risk Factors/Patient Goals at Admission:     Personal Goals and Risk Factors at Admission - 07/11/16 1619      Core Components/Risk Factors/Patient Goals on Admission    Weight Management Weight Maintenance   Improve shortness of breath with ADL's Yes   Intervention Provide education, individualized exercise plan and daily activity instruction to help decrease symptoms of SOB with activities of daily living.   Expected Outcomes Short Term: Achieves a reduction of symptoms when performing activities of daily living.   Personal Goal Other Yes   Personal Goal Increase heart muscle strength, gain physical strength   Intervention Attend CR 3 x week and supplement exercise at home 2 x week   Expected Outcomes To reach personal goals.       Core Components/Risk Factors/Patient Goals Review:      Goals and Risk Factor Review    Row Name 07/11/16 1620             Core Components/Risk Factors/Patient Goals Review   Personal Goals Review Improve shortness of breath with ADL's  Increase heart muscle strength          Core Components/Risk Factors/Patient Goals at Discharge (Final Review):      Goals and Risk Factor Review - 07/11/16 1620  Core Components/Risk Factors/Patient Goals Review   Personal Goals Review Improve shortness of breath with ADL's  Increase heart muscle strength      ITP Comments:     ITP Comments    Row Name 07/30/16 1312 08/18/16 1312         ITP Comments Patient new to the program completing 3 sessions with some progression. He has not attended since 07/23/16. Will continue to monitor.  Patient [facility] been in a  [facility] and plans to receive PT at home. We will evaluate him to see if he is able to complete the program after completing his PT.          Comments: ITP 30 Day REVIEW Patient has been in a NCF and plans to receive PT at home. We will evaluate him to see if he is able to complete the program after completing his PT.

## 2016-08-18 NOTE — Addendum Note (Signed)
Encounter addended by: Dwana Melena, RN on: 08/18/2016  1:15 PM<BR>    Actions taken: Visit Navigator Flowsheet section accepted, Sign clinical note

## 2016-08-20 ENCOUNTER — Encounter (HOSPITAL_COMMUNITY): Payer: Medicare Other

## 2016-08-20 DIAGNOSIS — Z713 Dietary counseling and surveillance: Secondary | ICD-10-CM | POA: Diagnosis not present

## 2016-08-20 DIAGNOSIS — E039 Hypothyroidism, unspecified: Secondary | ICD-10-CM | POA: Diagnosis not present

## 2016-08-20 DIAGNOSIS — I2510 Atherosclerotic heart disease of native coronary artery without angina pectoris: Secondary | ICD-10-CM | POA: Diagnosis not present

## 2016-08-20 DIAGNOSIS — I951 Orthostatic hypotension: Secondary | ICD-10-CM | POA: Diagnosis not present

## 2016-08-20 DIAGNOSIS — E7800 Pure hypercholesterolemia, unspecified: Secondary | ICD-10-CM | POA: Diagnosis not present

## 2016-08-20 DIAGNOSIS — M6281 Muscle weakness (generalized): Secondary | ICD-10-CM | POA: Diagnosis not present

## 2016-08-20 DIAGNOSIS — K219 Gastro-esophageal reflux disease without esophagitis: Secondary | ICD-10-CM | POA: Diagnosis not present

## 2016-08-20 DIAGNOSIS — Z6825 Body mass index (BMI) 25.0-25.9, adult: Secondary | ICD-10-CM | POA: Diagnosis not present

## 2016-08-20 DIAGNOSIS — E785 Hyperlipidemia, unspecified: Secondary | ICD-10-CM | POA: Diagnosis not present

## 2016-08-20 DIAGNOSIS — Z299 Encounter for prophylactic measures, unspecified: Secondary | ICD-10-CM | POA: Diagnosis not present

## 2016-08-20 DIAGNOSIS — I259 Chronic ischemic heart disease, unspecified: Secondary | ICD-10-CM | POA: Diagnosis not present

## 2016-08-20 DIAGNOSIS — J449 Chronic obstructive pulmonary disease, unspecified: Secondary | ICD-10-CM | POA: Diagnosis not present

## 2016-08-20 DIAGNOSIS — K922 Gastrointestinal hemorrhage, unspecified: Secondary | ICD-10-CM | POA: Diagnosis not present

## 2016-08-21 ENCOUNTER — Encounter (HOSPITAL_COMMUNITY): Payer: Medicare Other

## 2016-08-21 DIAGNOSIS — I2510 Atherosclerotic heart disease of native coronary artery without angina pectoris: Secondary | ICD-10-CM | POA: Diagnosis not present

## 2016-08-21 DIAGNOSIS — J449 Chronic obstructive pulmonary disease, unspecified: Secondary | ICD-10-CM | POA: Diagnosis not present

## 2016-08-21 DIAGNOSIS — I951 Orthostatic hypotension: Secondary | ICD-10-CM | POA: Diagnosis not present

## 2016-08-21 DIAGNOSIS — M6281 Muscle weakness (generalized): Secondary | ICD-10-CM | POA: Diagnosis not present

## 2016-08-21 DIAGNOSIS — E785 Hyperlipidemia, unspecified: Secondary | ICD-10-CM | POA: Diagnosis not present

## 2016-08-21 DIAGNOSIS — K219 Gastro-esophageal reflux disease without esophagitis: Secondary | ICD-10-CM | POA: Diagnosis not present

## 2016-08-22 ENCOUNTER — Encounter (HOSPITAL_COMMUNITY): Payer: Medicare Other

## 2016-08-22 DIAGNOSIS — I951 Orthostatic hypotension: Secondary | ICD-10-CM | POA: Diagnosis not present

## 2016-08-22 DIAGNOSIS — K219 Gastro-esophageal reflux disease without esophagitis: Secondary | ICD-10-CM | POA: Diagnosis not present

## 2016-08-22 DIAGNOSIS — M6281 Muscle weakness (generalized): Secondary | ICD-10-CM | POA: Diagnosis not present

## 2016-08-22 DIAGNOSIS — I2510 Atherosclerotic heart disease of native coronary artery without angina pectoris: Secondary | ICD-10-CM | POA: Diagnosis not present

## 2016-08-22 DIAGNOSIS — J449 Chronic obstructive pulmonary disease, unspecified: Secondary | ICD-10-CM | POA: Diagnosis not present

## 2016-08-22 DIAGNOSIS — E785 Hyperlipidemia, unspecified: Secondary | ICD-10-CM | POA: Diagnosis not present

## 2016-08-25 ENCOUNTER — Encounter (HOSPITAL_COMMUNITY): Payer: Medicare Other

## 2016-08-25 DIAGNOSIS — I951 Orthostatic hypotension: Secondary | ICD-10-CM | POA: Diagnosis not present

## 2016-08-25 DIAGNOSIS — M6281 Muscle weakness (generalized): Secondary | ICD-10-CM | POA: Diagnosis not present

## 2016-08-25 DIAGNOSIS — J449 Chronic obstructive pulmonary disease, unspecified: Secondary | ICD-10-CM | POA: Diagnosis not present

## 2016-08-25 DIAGNOSIS — K219 Gastro-esophageal reflux disease without esophagitis: Secondary | ICD-10-CM | POA: Diagnosis not present

## 2016-08-25 DIAGNOSIS — I2510 Atherosclerotic heart disease of native coronary artery without angina pectoris: Secondary | ICD-10-CM | POA: Diagnosis not present

## 2016-08-25 DIAGNOSIS — E785 Hyperlipidemia, unspecified: Secondary | ICD-10-CM | POA: Diagnosis not present

## 2016-08-27 ENCOUNTER — Encounter (HOSPITAL_COMMUNITY): Payer: Medicare Other

## 2016-08-27 DIAGNOSIS — M6281 Muscle weakness (generalized): Secondary | ICD-10-CM | POA: Diagnosis not present

## 2016-08-27 DIAGNOSIS — I951 Orthostatic hypotension: Secondary | ICD-10-CM | POA: Diagnosis not present

## 2016-08-27 DIAGNOSIS — J449 Chronic obstructive pulmonary disease, unspecified: Secondary | ICD-10-CM | POA: Diagnosis not present

## 2016-08-27 DIAGNOSIS — K219 Gastro-esophageal reflux disease without esophagitis: Secondary | ICD-10-CM | POA: Diagnosis not present

## 2016-08-27 DIAGNOSIS — E785 Hyperlipidemia, unspecified: Secondary | ICD-10-CM | POA: Diagnosis not present

## 2016-08-27 DIAGNOSIS — I2510 Atherosclerotic heart disease of native coronary artery without angina pectoris: Secondary | ICD-10-CM | POA: Diagnosis not present

## 2016-08-28 DIAGNOSIS — E785 Hyperlipidemia, unspecified: Secondary | ICD-10-CM | POA: Diagnosis not present

## 2016-08-28 DIAGNOSIS — M6281 Muscle weakness (generalized): Secondary | ICD-10-CM | POA: Diagnosis not present

## 2016-08-28 DIAGNOSIS — K219 Gastro-esophageal reflux disease without esophagitis: Secondary | ICD-10-CM | POA: Diagnosis not present

## 2016-08-28 DIAGNOSIS — I2510 Atherosclerotic heart disease of native coronary artery without angina pectoris: Secondary | ICD-10-CM | POA: Diagnosis not present

## 2016-08-28 DIAGNOSIS — I951 Orthostatic hypotension: Secondary | ICD-10-CM | POA: Diagnosis not present

## 2016-08-28 DIAGNOSIS — J449 Chronic obstructive pulmonary disease, unspecified: Secondary | ICD-10-CM | POA: Diagnosis not present

## 2016-08-29 ENCOUNTER — Encounter (HOSPITAL_COMMUNITY): Payer: Medicare Other

## 2016-08-29 DIAGNOSIS — I951 Orthostatic hypotension: Secondary | ICD-10-CM | POA: Diagnosis not present

## 2016-08-29 DIAGNOSIS — E785 Hyperlipidemia, unspecified: Secondary | ICD-10-CM | POA: Diagnosis not present

## 2016-08-29 DIAGNOSIS — J449 Chronic obstructive pulmonary disease, unspecified: Secondary | ICD-10-CM | POA: Diagnosis not present

## 2016-08-29 DIAGNOSIS — M6281 Muscle weakness (generalized): Secondary | ICD-10-CM | POA: Diagnosis not present

## 2016-08-29 DIAGNOSIS — I2510 Atherosclerotic heart disease of native coronary artery without angina pectoris: Secondary | ICD-10-CM | POA: Diagnosis not present

## 2016-08-29 DIAGNOSIS — K219 Gastro-esophageal reflux disease without esophagitis: Secondary | ICD-10-CM | POA: Diagnosis not present

## 2016-09-01 ENCOUNTER — Encounter (HOSPITAL_COMMUNITY): Payer: Medicare Other

## 2016-09-01 DIAGNOSIS — I2510 Atherosclerotic heart disease of native coronary artery without angina pectoris: Secondary | ICD-10-CM | POA: Diagnosis not present

## 2016-09-01 DIAGNOSIS — E785 Hyperlipidemia, unspecified: Secondary | ICD-10-CM | POA: Diagnosis not present

## 2016-09-01 DIAGNOSIS — I951 Orthostatic hypotension: Secondary | ICD-10-CM | POA: Diagnosis not present

## 2016-09-01 DIAGNOSIS — K219 Gastro-esophageal reflux disease without esophagitis: Secondary | ICD-10-CM | POA: Diagnosis not present

## 2016-09-01 DIAGNOSIS — M6281 Muscle weakness (generalized): Secondary | ICD-10-CM | POA: Diagnosis not present

## 2016-09-01 DIAGNOSIS — J449 Chronic obstructive pulmonary disease, unspecified: Secondary | ICD-10-CM | POA: Diagnosis not present

## 2016-09-02 DIAGNOSIS — I2510 Atherosclerotic heart disease of native coronary artery without angina pectoris: Secondary | ICD-10-CM | POA: Diagnosis not present

## 2016-09-02 DIAGNOSIS — K219 Gastro-esophageal reflux disease without esophagitis: Secondary | ICD-10-CM | POA: Diagnosis not present

## 2016-09-02 DIAGNOSIS — J449 Chronic obstructive pulmonary disease, unspecified: Secondary | ICD-10-CM | POA: Diagnosis not present

## 2016-09-02 DIAGNOSIS — I951 Orthostatic hypotension: Secondary | ICD-10-CM | POA: Diagnosis not present

## 2016-09-02 DIAGNOSIS — E785 Hyperlipidemia, unspecified: Secondary | ICD-10-CM | POA: Diagnosis not present

## 2016-09-02 DIAGNOSIS — M6281 Muscle weakness (generalized): Secondary | ICD-10-CM | POA: Diagnosis not present

## 2016-09-03 ENCOUNTER — Encounter: Payer: Self-pay | Admitting: *Deleted

## 2016-09-03 ENCOUNTER — Encounter (HOSPITAL_COMMUNITY): Payer: Medicare Other

## 2016-09-03 DIAGNOSIS — J449 Chronic obstructive pulmonary disease, unspecified: Secondary | ICD-10-CM | POA: Diagnosis not present

## 2016-09-03 DIAGNOSIS — K219 Gastro-esophageal reflux disease without esophagitis: Secondary | ICD-10-CM | POA: Diagnosis not present

## 2016-09-03 DIAGNOSIS — I951 Orthostatic hypotension: Secondary | ICD-10-CM | POA: Diagnosis not present

## 2016-09-03 DIAGNOSIS — M6281 Muscle weakness (generalized): Secondary | ICD-10-CM | POA: Diagnosis not present

## 2016-09-03 DIAGNOSIS — I2510 Atherosclerotic heart disease of native coronary artery without angina pectoris: Secondary | ICD-10-CM | POA: Diagnosis not present

## 2016-09-03 DIAGNOSIS — E785 Hyperlipidemia, unspecified: Secondary | ICD-10-CM | POA: Diagnosis not present

## 2016-09-03 NOTE — Addendum Note (Signed)
Encounter addended by: Dwana Melena, RN on: 09/03/2016  2:36 PM<BR>    Actions taken: Visit Navigator Flowsheet section accepted, Sign clinical note, Episode resolved

## 2016-09-03 NOTE — Progress Notes (Signed)
Cardiac Individual Treatment Plan  Patient Details  Name: Rodney Bowman MRN: [id number] Date of Birth: [birth year] Referring Provider:     CARDIAC REHAB PHASE II ORIENTATION from 07/11/2016 in [place]  Referring Provider  Dr. Ysidro Evert      Initial Encounter Date:    CARDIAC REHAB PHASE II ORIENTATION from 07/11/2016 in [place]  Date  07/11/16  Referring Provider  Dr. Ysidro Evert      Visit Diagnosis: S/P TAVR (transcatheter aortic valve replacement)  Patient's Home Medications on Admission:  Current Outpatient Prescriptions:  .  acetaminophen (TYLENOL) 325 MG tablet, Take 650 mg by mouth every 6 (six) hours as needed., Disp: , Rfl:  .  alfuzosin (UROXATRAL) 10 MG 24 hr tablet, Take 10 mg by mouth daily with breakfast., Disp: , Rfl:  .  alfuzosin (UROXATRAL) 10 MG 24 hr tablet, Take 10 mg by mouth daily with breakfast., Disp: , Rfl:  .  aspirin EC 81 MG tablet, Take 81 mg by mouth daily., Disp: , Rfl:  .  aspirin EC 81 MG tablet, Take 81 mg by mouth daily., Disp: , Rfl:  .  atorvastatin (LIPITOR) 80 MG tablet, Take 80 mg by mouth daily. Reported on 08/20/2015, Disp: , Rfl:  .  atorvastatin (LIPITOR) 80 MG tablet, Take 80 mg by mouth daily at 6 PM., Disp: , Rfl:  .  calcium carbonate (OSCAL) 1500 (600 Ca) MG TABS tablet, Take 1,500 mg by mouth 3 (three) times daily with meals., Disp: , Rfl:  .  clopidogrel (PLAVIX) 75 MG tablet, Take 75 mg by mouth daily., Disp: , Rfl:  .  cyanocobalamin 500 MCG tablet, Take 500 mcg by mouth daily., Disp: , Rfl:  .  esomeprazole (NEXIUM) 40 MG capsule, Take 40 mg by mouth daily at 12 noon., Disp: , Rfl:  .  fluticasone (FLONASE) 50 MCG/ACT nasal spray, Place 1 spray into both nostrils 2 (two) times daily., Disp: , Rfl:  .  fluticasone furoate-vilanterol (BREO ELLIPTA) 100-25 MCG/INH AEPB, Inhale 1 puff into the lungs daily. Reported on 08/20/2015, Disp: , Rfl:  .  Fluticasone Propionate (FLONASE ALLERGY RELIEF  NA), Place into the nose 2 (two) times daily., Disp: , Rfl:  .  levothyroxine (SYNTHROID, LEVOTHROID) 125 MCG tablet, Take 125 mcg by mouth daily before breakfast., Disp: , Rfl:  .  levothyroxine (SYNTHROID, LEVOTHROID) 125 MCG tablet, Take 125 mcg by mouth daily before breakfast., Disp: , Rfl:  .  Multiple Vitamin (MULTIVITAMIN) capsule, Take 1 capsule by mouth daily., Disp: , Rfl:  .  mycophenolate (CELLCEPT) 500 MG tablet, Take 500-1,000 mg by mouth 2 (two) times daily. *takes 1000mg  in the morning and 500mg  at night*, Disp: , Rfl:  .  mycophenolate (CELLCEPT) 500 MG tablet, Take 1,000 mg by mouth 2 (two) times daily. Takes 2 tablets in the morning and 1 tablet in the evening, Disp: , Rfl:  .  niacin (NIASPAN) 500 MG CR tablet, Take 500 mg by mouth 2 (two) times daily. , Disp: , Rfl:  .  Omega-3 Fatty Acids (FISH OIL PO), Take 2 tablets by mouth daily. , Disp: , Rfl:  .  omeprazole-sodium bicarbonate (ZEGERID) 40-1100 MG capsule, Take 1 capsule by mouth daily before breakfast. Reported on 08/20/2015, Disp: , Rfl:  .  oxybutynin (DITROPAN-XL) 5 MG 24 hr tablet, Take 5 mg by mouth at bedtime., Disp: , Rfl:  .  oxyCODONE (OXY IR/ROXICODONE) 5 MG immediate release tablet, Take 5 mg by mouth every 6 (  six) hours as needed for severe pain., Disp: , Rfl:  .  OXYGEN-HELIUM IN, 3-5 L. , Disp: , Rfl:  .  pantoprazole (PROTONIX) 40 MG tablet, Take 40 mg by mouth daily., Disp: , Rfl:  .  predniSONE (DELTASONE) 10 MG tablet, Take 10 mg by mouth daily with breakfast., Disp: , Rfl:  .  predniSONE (DELTASONE) 20 MG tablet, Take 10 mg by mouth daily with breakfast. , Disp: , Rfl:  .  risedronate (ACTONEL) 35 MG tablet, Take 35 mg by mouth every 7 (seven) days. with water on empty stomach, nothing by mouth or lie down for next 30 minutes., Disp: , Rfl:  .  risedronate (ACTONEL) 35 MG tablet, Take 35 mg by mouth every 7 (seven) days. with water on empty stomach, nothing by mouth or lie down for next 30 minutes., Disp:  , Rfl:  .  sennosides-docusate sodium (SENOKOT-S) 8.6-50 MG tablet, Take 2 tablets by mouth 2 (two) times daily., Disp: , Rfl:  .  sulfamethoxazole-trimethoprim (BACTRIM DS,SEPTRA DS) 800-160 MG tablet, Take 1 tablet by mouth 3 (three) times a week., Disp: , Rfl:   Past Medical History: Past Medical History:  Diagnosis Date  . AAA (abdominal aortic aneurysm) ([place])    Doppler (Dr Trena Platt office 07/2010...stable 3.2)  . Carotid artery disease ([facility])    Doppler, Dr. Manuella Ghazi office, June, 2012, report scanned to this EMR, less than 50% bilateral  . Connective tissue disease ([place])    followed at [facility] interstitial lung clinic  . COPD (chronic obstructive pulmonary disease) ([place])   . Ejection fraction    Normal ejection fraction, echo, October 14, 2010,  Dr Manuella Ghazi office,, reports scan the disc E. MR  . Hyperlipidemia   . Hypothyroidism   . Interstitial lung disease ([place])    followed at [facility]  . Severe aortic stenosis 07/02/2010   Qualifier: Diagnosis of  By: Ron Parker, MD, [facility], [place]inda [place]     Tobacco Use: History  Smoking Status  . Former Smoker  . Packs/day: 1.00  . Years: 25.00  . Types: Cigarettes  . Start date: 02/18/1940  . Quit date: 12/09/1964  Smokeless Tobacco  . Never Used    Labs: Recent Review Flowsheet Data    Labs for ITP Cardiac and Pulmonary Rehab Latest Ref Rng & Units 12/15/2013 12/15/2013   PHART 7.350 - 7.450 7.367 -   PCO2ART 35.0 - 45.0 mmHg 41.2 -   HCO3 20.0 - 24.0 mEq/L 23.7 24.4(H)   TCO2 0 - 100 mmol/L 25 26   ACIDBASEDEF 0.0 - 2.0 mmol/L 2.0 1.0   O2SAT % 94.0 68.0      Capillary Blood Glucose: No results found for: GLUCAP   Exercise Target Goals:    Exercise Program Goal: Individual exercise prescription set with THRR, safety & activity barriers. Participant demonstrates ability to understand and report RPE using BORG scale, to self-measure pulse accurately, and to acknowledge the importance of the exercise prescription.  Exercise Prescription  Goal: Starting with aerobic activity 30 plus minutes a day, 3 days per week for initial exercise prescription. Provide home exercise prescription and guidelines that participant acknowledges understanding prior to discharge.  Activity Barriers & Risk Stratification:     Activity Barriers & Cardiac Risk Stratification - 07/11/16 1617      Activity Barriers & Cardiac Risk Stratification   Activity Barriers Muscular Weakness  Hard of Hearing   Cardiac Risk Stratification High      6 Minute Walk:   Oxygen Initial Assessment:  Oxygen Initial Assessment - 07/11/16 1604      Home Oxygen   Home Oxygen Device Liquid Oxygen   Sleep Oxygen Prescription Continuous   Liters per minute 4   Liters per minute 5   Home at Rest Exercise Oxygen Prescription Continuous   Liters per minute 3     Initial 6 min Walk   Oxygen Used Continuous   Liters per minute 5   Resting Oxygen Saturation  during 6 min walk 99 %   Exercise Oxygen Saturation  during 6 min walk 92 %     Program Oxygen Prescription   Program Oxygen Prescription Continuous     Intervention   Short Term Goals To learn and exhibit compliance with exercise, home and travel O2 prescription;To Learn and understand importance of maintaining oxygen saturations>88%   Long  Term Goals Exhibits compliance with exercise, home and travel O2 prescription;Demonstrates proper use of MDI's;Exhibits proper breathing techniques, such as purse lipped breathing or other method taught during program session      Oxygen Re-Evaluation:   Oxygen Discharge (Final Oxygen Re-Evaluation):   Initial Exercise Prescription:     Initial Exercise Prescription - 07/11/16 1500      Date of Initial Exercise RX and Referring Provider   Date 07/11/16   Referring Provider Dr. Ysidro Evert     NuStep   Level 2   SPM 11   Minutes 20   METs 1.8     Arm Ergometer   Level 1.5   Watts 15   RPM 15   Minutes 15   METs 1.6     Prescription Details    Frequency (times per week) 3   Duration Progress to 30 minutes of continuous aerobic without signs/symptoms of physical distress     Intensity   THRR 40-80% of Max Heartrate 101-110-119   Ratings of Perceived Exertion 11-13   Perceived Dyspnea 0-4     Progression   Progression Continue progressive overload as per policy without signs/symptoms or physical distress.     Resistance Training   Training Prescription Yes   Weight 1   Reps 10-15      Perform Capillary Blood Glucose checks as needed.  Exercise Prescription Changes:      Exercise Prescription Changes    Row Name 07/29/16 1200             Response to Exercise   Blood Pressure (Admit) 98/50       Blood Pressure (Exercise) 102/42       Blood Pressure (Exit) 98/48       Heart Rate (Admit) 83 bpm       Heart Rate (Exercise) 89 bpm       Heart Rate (Exit) 86 bpm       Rating of Perceived Exertion (Exercise) 11       Duration Progress to 30 minutes of  aerobic without signs/symptoms of physical distress       Intensity THRR unchanged         Progression   Progression Continue to progress workloads to maintain intensity without signs/symptoms of physical distress.         Resistance Training   Training Prescription Yes       Weight 1       Reps 10-15         NuStep   Level 2       SPM 11       Minutes 30       METs  1.8         Home Exercise Plan   Plans to continue exercise at Home (comment)       Frequency Add 2 additional days to program exercise sessions.          Exercise Comments:      Exercise Comments    Row Name 07/29/16 1248 08/15/16 1503         Exercise Comments Patient is doing well. He is staying on Nustep for both times  Patient has not yet returned to CR since last session, 07/23/2016. No progression has been made.          Exercise Goals and Review:      Exercise Goals    Row Name 07/11/16 1618             Exercise Goals   Increase Physical Activity Yes        Intervention Provide advice, education, support and counseling about physical activity/exercise needs.;Develop an individualized exercise prescription for aerobic and resistive training based on initial evaluation findings, risk stratification, comorbidities and participant's personal goals.       Expected Outcomes Achievement of increased cardiorespiratory fitness and enhanced flexibility, muscular endurance and strength shown through measurements of functional capacity and personal statement of participant.       Increase Strength and Stamina Yes       Intervention Provide advice, education, support and counseling about physical activity/exercise needs.;Develop an individualized exercise prescription for aerobic and resistive training based on initial evaluation findings, risk stratification, comorbidities and participant's personal goals.       Expected Outcomes Achievement of increased cardiorespiratory fitness and enhanced flexibility, muscular endurance and strength shown through measurements of functional capacity and personal statement of participant.          Exercise Goals Re-Evaluation :    Discharge Exercise Prescription (Final Exercise Prescription Changes):     Exercise Prescription Changes - 07/29/16 1200      Response to Exercise   Blood Pressure (Admit) 98/50   Blood Pressure (Exercise) 102/42   Blood Pressure (Exit) 98/48   Heart Rate (Admit) 83 bpm   Heart Rate (Exercise) 89 bpm   Heart Rate (Exit) 86 bpm   Rating of Perceived Exertion (Exercise) 11   Duration Progress to 30 minutes of  aerobic without signs/symptoms of physical distress   Intensity THRR unchanged     Progression   Progression Continue to progress workloads to maintain intensity without signs/symptoms of physical distress.     Resistance Training   Training Prescription Yes   Weight 1   Reps 10-15     NuStep   Level 2   SPM 11   Minutes 30   METs 1.8     Home Exercise Plan   Plans to  continue exercise at Home (comment)   Frequency Add 2 additional days to program exercise sessions.      Nutrition:  Target Goals: Understanding of nutrition guidelines, daily intake of sodium 1500mg , cholesterol 200mg , calories 30% from fat and 7% or less from saturated fats, daily to have 5 or more servings of fruits and vegetables.  Biometrics:     Pre Biometrics - 07/11/16 1514      Pre Biometrics   Height 5\' 10"  (1.778 m)   Weight 171 lb 8.3 oz (77.8 kg)   Waist Circumference 36.5 inches   Hip Circumference 41 inches   Waist to Hip Ratio 0.89 %   BMI (Calculated) 24.7   Triceps Skinfold  9 mm   % Body Fat 31.8 %   Grip Strength 65 kg   Flexibility 0 in  Patient could not do flex test   Single Leg Stand 0 seconds  Patient felt too uneasy        Nutrition Therapy Plan and Nutrition Goals:   Nutrition Discharge: Rate Your Plate Scores:     Nutrition Assessments - 07/11/16 1618      MEDFICTS Scores   Pre Score 58      Nutrition Goals Re-Evaluation:   Nutrition Goals Discharge (Final Nutrition Goals Re-Evaluation):   Psychosocial: Target Goals: Acknowledge presence or absence of significant depression and/or stress, maximize coping skills, provide positive support system. Participant is able to verbalize types and ability to use techniques and skills needed for reducing stress and depression.  Initial Review & Psychosocial Screening:     Initial Psych Review & Screening - 07/11/16 1621      Initial Review   Current issues with None Identified     Family Dynamics   Good Support System? Yes     Barriers   Psychosocial barriers to participate in program There are no identifiable barriers or psychosocial needs.     Screening Interventions   Interventions Encouraged to exercise      Quality of Life Scores:     Quality of Life - 07/11/16 1503      Quality of Life Scores   Health/Function Pre 20.04 %   Socioeconomic Pre 25.43 %    Psych/Spiritual Pre 24 %   Family Pre 24 %   GLOBAL Pre 22.62 %      PHQ-9: Recent Review Flowsheet Data    Depression screen [facility] 2/9 07/11/2016   Decreased Interest 0   Down, Depressed, Hopeless 0   PHQ - 2 Score 0   Tired, decreased energy 2   Change in appetite 3   Feeling bad or failure about yourself  0   Trouble concentrating 0   Moving slowly or fidgety/restless 0   Suicidal thoughts 0   Difficult doing work/chores Not difficult at all     Interpretation of Total Score  Total Score Depression Severity:  1-4 = Minimal depression, 5-9 = Mild depression, 10-14 = Moderate depression, 15-19 = Moderately severe depression, 20-27 = Severe depression   Psychosocial Evaluation and Intervention:     Psychosocial Evaluation - 07/11/16 1621      Psychosocial Evaluation & Interventions   Interventions Encouraged to exercise with the program and follow exercise prescription   Continue Psychosocial Services  No Follow up required      Psychosocial Re-Evaluation:   Psychosocial Discharge (Final Psychosocial Re-Evaluation):   Vocational Rehabilitation: Provide vocational rehab assistance to qualifying candidates.   Vocational Rehab Evaluation & Intervention:     Vocational Rehab - 07/11/16 1615      Initial Vocational Rehab Evaluation & Intervention   Assessment shows need for Vocational Rehabilitation No      Education: Education Goals: Education classes will be provided on a weekly basis, covering required topics. Participant will state understanding/return demonstration of topics presented.  Learning Barriers/Preferences:     Learning Barriers/Preferences - 07/11/16 1614      Learning Barriers/Preferences   Learning Barriers Hearing   Learning Preferences Individual Instruction;Group Instruction      Education Topics: Hypertension, Hypertension Reduction -Define heart disease and high blood pressure. Discus how high blood pressure affects the body and  ways to reduce high blood pressure.   Exercise and Your Heart -Discuss  why it is important to exercise, the FITT principles of exercise, normal and abnormal responses to exercise, and how to exercise safely.   Angina -Discuss definition of angina, causes of angina, treatment of angina, and how to decrease risk of having angina.   Cardiac Medications -Review what the following cardiac medications are used for, how they affect the body, and side effects that may occur when taking the medications.  Medications include Aspirin, Beta blockers, calcium channel blockers, ACE Inhibitors, angiotensin receptor blockers, diuretics, digoxin, and antihyperlipidemics.   Congestive Heart Failure -Discuss the definition of CHF, how to live with CHF, the signs and symptoms of CHF, and how keep track of weight and sodium intake.   Heart Disease and Intimacy -Discus the effect sexual activity has on the heart, how changes occur during intimacy as we age, and safety during sexual activity.   Smoking Cessation / COPD -Discuss different methods to quit smoking, the health benefits of quitting smoking, and the definition of COPD.   Nutrition I: Fats -Discuss the types of cholesterol, what cholesterol does to the heart, and how cholesterol levels can be controlled.   Nutrition II: Labels -Discuss the different components of food labels and how to read food label   CARDIAC REHAB PHASE II EXERCISE from 07/23/2016 in [place]  Date  07/23/16  Educator  DC  Instruction Review Code  2- meets goals/outcomes      Heart Parts and Heart Disease -Discuss the anatomy of the heart, the pathway of blood circulation through the heart, and these are affected by heart disease.   Stress I: Signs and Symptoms -Discuss the causes of stress, how stress may lead to anxiety and depression, and ways to limit stress.   Stress II: Relaxation -Discuss different types of relaxation techniques  to limit stress.   Warning Signs of Stroke / TIA -Discuss definition of a stroke, what the signs and symptoms are of a stroke, and how to identify when someone is having stroke.   Knowledge Questionnaire Score:     Knowledge Questionnaire Score - 07/11/16 1615      Knowledge Questionnaire Score   Pre Score 19/24      Core Components/Risk Factors/Patient Goals at Admission:     Personal Goals and Risk Factors at Admission - 07/11/16 1619      Core Components/Risk Factors/Patient Goals on Admission    Weight Management Weight Maintenance   Improve shortness of breath with ADL's Yes   Intervention Provide education, individualized exercise plan and daily activity instruction to help decrease symptoms of SOB with activities of daily living.   Expected Outcomes Short Term: Achieves a reduction of symptoms when performing activities of daily living.   Personal Goal Other Yes   Personal Goal Increase heart muscle strength, gain physical strength   Intervention Attend CR 3 x week and supplement exercise at home 2 x week   Expected Outcomes To reach personal goals.       Core Components/Risk Factors/Patient Goals Review:      Goals and Risk Factor Review    Row Name 07/11/16 1620             Core Components/Risk Factors/Patient Goals Review   Personal Goals Review Improve shortness of breath with ADL's  Increase heart muscle strength          Core Components/Risk Factors/Patient Goals at Discharge (Final Review):      Goals and Risk Factor Review - 07/11/16 1620  Core Components/Risk Factors/Patient Goals Review   Personal Goals Review Improve shortness of breath with ADL's  Increase heart muscle strength      ITP Comments:     ITP Comments    Row Name 07/30/16 1312 08/18/16 1312 09/03/16 1428       ITP Comments Patient new to the program completing 3 sessions with some progression. He has not attended since 07/23/16. Will continue to monitor.  Patient  has been in a [facility] and plans to receive PT at home. We will evaluate him to see if he is able to complete the program after completing his PT.  Patient completed 3 sessions. He is not able to return to the program d/t chronic illness.        Comments: Patient stopped coming to Cardiac Rehab on 07/23/16 after completing 3 sessions. Patient is not able to return to the program d/t chronic illness. Doctor will be informed.

## 2016-09-03 NOTE — Progress Notes (Signed)
Discharge Summary  Patient Details  Name: Rodney Bowman MRN: [id number] Date of Birth: Oct 08, 1921 Referring Provider:     CARDIAC REHAB PHASE II ORIENTATION from 07/11/2016 in [place]  Referring Provider  Dr. Ysidro Evert       Number of Visits: 3  Reason for Discharge:  Early Exit:  Personal and Patient not able to return to the program d/t chronic illness.  Smoking History:  History  Smoking Status  . Former Smoker  . Packs/day: 1.00  . Years: 25.00  . Types: Cigarettes  . Start date: 02/18/1940  . Quit date: 12/09/1964  Smokeless Tobacco  . Never Used    Diagnosis:  S/P TAVR (transcatheter aortic valve replacement)  ADL UCSD:   Initial Exercise Prescription:     Initial Exercise Prescription - 07/11/16 1500      Date of Initial Exercise RX and Referring Provider   Date 07/11/16   Referring Provider Dr. Ysidro Evert     NuStep   Level 2   SPM 11   Minutes 20   METs 1.8     Arm Ergometer   Level 1.5   Watts 15   RPM 15   Minutes 15   METs 1.6     Prescription Details   Frequency (times per week) 3   Duration Progress to 30 minutes of continuous aerobic without signs/symptoms of physical distress     Intensity   THRR 40-80% of Max Heartrate 101-110-119   Ratings of Perceived Exertion 11-13   Perceived Dyspnea 0-4     Progression   Progression Continue progressive overload as per policy without signs/symptoms or physical distress.     Resistance Training   Training Prescription Yes   Weight 1   Reps 10-15      Discharge Exercise Prescription (Final Exercise Prescription Changes):     Exercise Prescription Changes - 07/29/16 1200      Response to Exercise   Blood Pressure (Admit) 98/50   Blood Pressure (Exercise) 102/42   Blood Pressure (Exit) 98/48   Heart Rate (Admit) 83 bpm   Heart Rate (Exercise) 89 bpm   Heart Rate (Exit) 86 bpm   Rating of Perceived Exertion (Exercise) 11   Duration Progress to 30 minutes of   aerobic without signs/symptoms of physical distress   Intensity THRR unchanged     Progression   Progression Continue to progress workloads to maintain intensity without signs/symptoms of physical distress.     Resistance Training   Training Prescription Yes   Weight 1   Reps 10-15     NuStep   Level 2   SPM 11   Minutes 30   METs 1.8     Home Exercise Plan   Plans to continue exercise at Home (comment)   Frequency Add 2 additional days to program exercise sessions.      Functional Capacity:   Psychological, QOL, Others - Outcomes: PHQ 2/9: Depression screen PHQ 2/9 07/11/2016  Decreased Interest 0  Down, Depressed, Hopeless 0  PHQ - 2 Score 0  Tired, decreased energy 2  Change in appetite 3  Feeling bad or failure about yourself  0  Trouble concentrating 0  Moving slowly or fidgety/restless 0  Suicidal thoughts 0  Difficult doing work/chores Not difficult at all    Quality of Life:     Quality of Life - 07/11/16 1503      Quality of Life Scores   Health/Function Pre 20.04 %   Socioeconomic Pre 25.43 %  Psych/Spiritual Pre 24 %   Family Pre 24 %   GLOBAL Pre 22.62 %      Personal Goals: Goals established at orientation with interventions provided to work toward goal.     Personal Goals and Risk Factors at Admission - 07/11/16 1619      Core Components/Risk Factors/Patient Goals on Admission    Weight Management Weight Maintenance   Improve shortness of breath with ADL's Yes   Intervention Provide education, individualized exercise plan and daily activity instruction to help decrease symptoms of SOB with activities of daily living.   Expected Outcomes Short Term: Achieves a reduction of symptoms when performing activities of daily living.   Personal Goal Other Yes   Personal Goal Increase heart muscle strength, gain physical strength   Intervention Attend CR 3 x week and supplement exercise at home 2 x week   Expected Outcomes To reach personal  goals.        Personal Goals Discharge:     Goals and Risk Factor Review    Row Name 07/11/16 1620             Core Components/Risk Factors/Patient Goals Review   Personal Goals Review Improve shortness of breath with ADL's  Increase heart muscle strength          Nutrition & Weight - Outcomes:     Pre Biometrics - 07/11/16 1514      Pre Biometrics   Height 5\' 10"  (1.778 m)   Weight 171 lb 8.3 oz (77.8 kg)   Waist Circumference 36.5 inches   Hip Circumference 41 inches   Waist to Hip Ratio 0.89 %   BMI (Calculated) 24.7   Triceps Skinfold 9 mm   % Body Fat 31.8 %   Grip Strength 65 kg   Flexibility 0 in  Patient could not do flex test   Single Leg Stand 0 seconds  Patient felt too uneasy        Nutrition:   Nutrition Discharge:     Nutrition Assessments - 07/11/16 1618      MEDFICTS Scores   Pre Score 58      Education Questionnaire Score:     Knowledge Questionnaire Score - 07/11/16 1615      Knowledge Questionnaire Score   Pre Score 19/24

## 2016-09-04 ENCOUNTER — Ambulatory Visit (INDEPENDENT_AMBULATORY_CARE_PROVIDER_SITE_OTHER): Payer: Medicare Other | Admitting: Cardiovascular Disease

## 2016-09-04 ENCOUNTER — Encounter: Payer: Self-pay | Admitting: Cardiovascular Disease

## 2016-09-04 VITALS — BP 85/46 | HR 63 | Ht 70.0 in | Wt 166.0 lb

## 2016-09-04 DIAGNOSIS — I959 Hypotension, unspecified: Secondary | ICD-10-CM

## 2016-09-04 DIAGNOSIS — I714 Abdominal aortic aneurysm, without rupture, unspecified: Secondary | ICD-10-CM

## 2016-09-04 DIAGNOSIS — I25118 Atherosclerotic heart disease of native coronary artery with other forms of angina pectoris: Secondary | ICD-10-CM | POA: Diagnosis not present

## 2016-09-04 DIAGNOSIS — Z952 Presence of prosthetic heart valve: Secondary | ICD-10-CM | POA: Diagnosis not present

## 2016-09-04 MED ORDER — FUROSEMIDE 20 MG PO TABS: 20.0000 mg | ORAL_TABLET | Freq: Every day | ORAL | Status: DC

## 2016-09-04 MED ORDER — ASPIRIN EC 81 MG PO TBEC
81.0000 mg | DELAYED_RELEASE_TABLET | Freq: Every day | ORAL | Status: DC
Start: 2016-09-04 — End: 2017-12-27

## 2016-09-04 NOTE — Progress Notes (Signed)
SUBJECTIVE: The patient presents for follow-up. He has a history of hypertensive heart disease, coronary artery disease, and aortic stenosis.  Echocardiogram on 07/18/15 showed vigorous left ventricular systolic function, EF 82-50%, moderate LVH, grade 1 diastolic dysfunction, and moderate aortic stenosis, mean gradient 29 mmHg.  He underwent left heart catheterization and coronary angiography on 12/15/2013. It demonstrated chronic subtotal occlusion of the right coronary artery with left-to-right collaterals, mild to moderate diffuse LAD and left circumflex coronary artery stenosis.  He was supposed to follow up with me in 6 months but did not. He was then evaluated by his pulmonologist at [facility] for increasing exertional dyspnea. He ultimately underwent TAVR at [facility] earlier this year. He then had an upper GI bleed requiring clipping and cauterization.   He has interstitial lung disease with emphysema and connective tissue disease. He sleeps with 4 L of oxygen and otherwise uses 6 L when active.  Blood pressure is 85/46 today. He denies fevers, dizziness, and lightheadedness. His exertional dyspnea is stable.  He tells me he is to stop Plavix this week.   Soc: Served in the [place] [employer] on [employer] in [place]. Married. They have 2 sons. One is a retired Theme park manager IT sales professional) in [place], [place]. Another son is retired from missions.   Review of Systems: As per "subjective", otherwise negative.  No Known Allergies  Current Outpatient Prescriptions  Medication Sig Dispense Refill  . acetaminophen (TYLENOL) 325 MG tablet Take 650 mg by mouth every 6 (six) hours as needed.    Marland Kitchen alfuzosin (UROXATRAL) 10 MG 24 hr tablet Take 10 mg by mouth daily with breakfast.    . atorvastatin (LIPITOR) 80 MG tablet Take 80 mg by mouth daily at 6 PM.    . calcium carbonate (OSCAL) 1500 (600 Ca) MG TABS tablet Take 1,500 mg by mouth 3 (three) times daily with meals.    . clopidogrel (PLAVIX) 75 MG tablet Take  75 mg by mouth daily.    . cyanocobalamin 500 MCG tablet Take 500 mcg by mouth daily.    . fluticasone (FLONASE) 50 MCG/ACT nasal spray Place 1 spray into both nostrils 2 (two) times daily.    . furosemide (LASIX) 20 MG tablet Take 1 tablet by mouth daily.  11  . ipratropium-albuterol (DUONEB) 0.5-2.5 (3) MG/3ML SOLN Inhale 3 mLs into the lungs every 4 (four) hours as needed.  0  . levothyroxine (SYNTHROID, LEVOTHROID) 125 MCG tablet Take 62.5 mcg by mouth daily before breakfast.     . Multiple Vitamin (MULTIVITAMIN) capsule Take 1 capsule by mouth daily.    . mycophenolate (CELLCEPT) 500 MG tablet Take 1,000 mg by mouth 2 (two) times daily. Takes 2 tablets in the morning and 1 tablet in the evening    . Omega-3 Fatty Acids (FISH OIL PO) Take 2 tablets by mouth daily.     . OXYGEN-HELIUM IN 3-5 L.     . pantoprazole (PROTONIX) 40 MG tablet Take 40 mg by mouth daily.    . predniSONE (DELTASONE) 10 MG tablet Take 10 mg by mouth daily with breakfast.    . risedronate (ACTONEL) 35 MG tablet Take 35 mg by mouth every 7 (seven) days. with water on empty stomach, nothing by mouth or lie down for next 30 minutes.    . sulfamethoxazole-trimethoprim (BACTRIM DS,SEPTRA DS) 800-160 MG tablet Take 1 tablet by mouth 3 (three) times a week.     No current facility-administered medications for this visit.  Past Medical History:  Diagnosis Date  . AAA (abdominal aortic aneurysm) ([place])    Doppler (Dr Trena Platt office 07/2010...stable 3.2)  . Carotid artery disease (HCC)    Doppler, Dr. Manuella Ghazi office, June, 2012, report scanned to this EMR, less than 50% bilateral  . Connective tissue disease ([place])    followed at [facility] interstitial lung clinic  . COPD (chronic obstructive pulmonary disease) ([place])   . Ejection fraction    Normal ejection fraction, echo, October 14, 2010,  Dr Manuella Ghazi office,, reports scan the disc E. MR  . Hyperlipidemia   . Hypothyroidism   . Interstitial lung disease ([place])    followed at [facility]  .  Severe aortic stenosis 07/02/2010   Qualifier: Diagnosis of  By: Ron Parker, MD, [facility], [place] [place]     Past Surgical History:  Procedure Laterality Date  . COLONOSCOPY  1999   Dr. Lindalou Hose, reportedly had anal stenosis   . COLONOSCOPY  2010   ?normal per PCP records  . HERNIA REPAIR Bilateral    bilateral inguinal hernia repair  . LEFT AND RIGHT HEART CATHETERIZATION WITH CORONARY ANGIOGRAM N/A 12/15/2013   Procedure: LEFT AND RIGHT HEART CATHETERIZATION WITH CORONARY ANGIOGRAM;  Surgeon: Blane Ohara, MD;  Location: [facility] CATH LAB;  Service: Cardiovascular;  Laterality: N/A;  . PARTIAL TURP  03/01/2014   DR. BAUER    Social History   Social History  . Marital status: Married    Spouse name: N/A  . Number of children: N/A  . Years of education: N/A   Occupational History  . Not on file.   Social History Main Topics  . Smoking status: Former Smoker    Packs/day: 1.00    Years: 25.00    Types: Cigarettes    Start date: 02/18/1940    Quit date: 12/09/1964  . Smokeless tobacco: Never Used  . Alcohol use No  . Drug use: No  . Sexual activity: Not on file   Other Topics Concern  . Not on file   Social History Narrative  . No narrative on file     Vitals:   09/04/16 1439 09/04/16 1449  BP: (!) 87/40 (!) 85/46  Pulse: 61 63  SpO2: 100% 99%  Weight: 166 lb (75.3 kg)   Height: 5\' 10"  (1.778 m)     Wt Readings from Last 3 Encounters:  09/04/16 166 lb (75.3 kg)  07/11/16 171 lb 8.3 oz (77.8 kg)  08/20/15 175 lb (79.4 kg)     PHYSICAL EXAM General: NAD, using oxygen by nasal cannula  Neck: No JVD, no thyromegaly or thyroid nodule.  Lungs: Diffuse dry crackles, no wheezes.  CV: Nondisplaced PMI. Regular rate and irregular rhythm, normal S1/S2, no S3/S4, no murmurs. No peripheral edema. Abdomen: Soft, no distention.  Skin: Intact without lesions or rashes.  Neurologic: Alert and oriented x 3.  Psych: Normal affect.  Skin: Normal. Musculoskeletal: No gross  deformities.    ECG: Most recent ECG reviewed.   Labs: Lab Results  Component Value Date/Time   K 4.5 12/13/2013 03:06 PM   BUN 12 12/13/2013 03:06 PM   CREATININE 1.7 (H) 12/13/2013 03:06 PM   HGB 11.7 (L) 12/13/2013 03:06 PM     Lipids: No results found for: LDLCALC, LDLDIRECT, CHOL, TRIG, HDL     ASSESSMENT AND PLAN: 1. Aortic stenosis s/p TAVR: He appears to be stable. He tells me he is to stop Plavix later this week. I will start aspirin 81 mg daily afterwards and check a  CBC given h/o GI bleeding.  2. CAD: Symptomatically stable. He is on Lipitor. I will restart aspirin after he stops Plavix.  3. AAA: Followed by PCP. 3.2 cm on 08/11/11.  4. Hypotension: I will have him hold Lasix. He is not on antihypertensive therapy. I will check a CBC to evaluate for anemia and basic metabolic panel to evaluate renal function. He says he hydrates with water.    Disposition: Follow up 2 months  Time spent: 40 minutes, of which greater than 50% was spent reviewing symptoms, relevant blood tests and studies, and discussing management plan with the patient.   Kate Sable, M.D., F.A.C.C.

## 2016-09-04 NOTE — Patient Instructions (Addendum)
Medication Instructions:   Stopping Plavix this week.  Begin Aspirin 81mg  daily afterwards.  Hold Lasix for now, until next office visit.    Continue all other medications.    Labwork:  CBC, BMET - orders given today.  Office will contact with results via phone or letter.    Testing/Procedures: none  Follow-Up: 2 months   Any Other Special Instructions Will Be Listed Below (If Applicable).  If you need a refill on your cardiac medications before your next appointment, please call your pharmacy.

## 2016-09-05 ENCOUNTER — Encounter (HOSPITAL_COMMUNITY): Payer: Medicare Other

## 2016-09-08 ENCOUNTER — Encounter (HOSPITAL_COMMUNITY): Payer: Medicare Other

## 2016-09-08 DIAGNOSIS — I2510 Atherosclerotic heart disease of native coronary artery without angina pectoris: Secondary | ICD-10-CM | POA: Diagnosis not present

## 2016-09-08 DIAGNOSIS — I951 Orthostatic hypotension: Secondary | ICD-10-CM | POA: Diagnosis not present

## 2016-09-08 DIAGNOSIS — M6281 Muscle weakness (generalized): Secondary | ICD-10-CM | POA: Diagnosis not present

## 2016-09-08 DIAGNOSIS — J449 Chronic obstructive pulmonary disease, unspecified: Secondary | ICD-10-CM | POA: Diagnosis not present

## 2016-09-08 DIAGNOSIS — K219 Gastro-esophageal reflux disease without esophagitis: Secondary | ICD-10-CM | POA: Diagnosis not present

## 2016-09-08 DIAGNOSIS — E785 Hyperlipidemia, unspecified: Secondary | ICD-10-CM | POA: Diagnosis not present

## 2016-09-09 ENCOUNTER — Other Ambulatory Visit: Payer: Self-pay | Admitting: Cardiovascular Disease

## 2016-09-09 DIAGNOSIS — E785 Hyperlipidemia, unspecified: Secondary | ICD-10-CM | POA: Diagnosis not present

## 2016-09-09 DIAGNOSIS — I951 Orthostatic hypotension: Secondary | ICD-10-CM | POA: Diagnosis not present

## 2016-09-09 DIAGNOSIS — I959 Hypotension, unspecified: Secondary | ICD-10-CM | POA: Diagnosis not present

## 2016-09-09 DIAGNOSIS — K219 Gastro-esophageal reflux disease without esophagitis: Secondary | ICD-10-CM | POA: Diagnosis not present

## 2016-09-09 DIAGNOSIS — J449 Chronic obstructive pulmonary disease, unspecified: Secondary | ICD-10-CM | POA: Diagnosis not present

## 2016-09-09 DIAGNOSIS — M6281 Muscle weakness (generalized): Secondary | ICD-10-CM | POA: Diagnosis not present

## 2016-09-09 DIAGNOSIS — I2510 Atherosclerotic heart disease of native coronary artery without angina pectoris: Secondary | ICD-10-CM | POA: Diagnosis not present

## 2016-09-10 ENCOUNTER — Encounter (HOSPITAL_COMMUNITY): Payer: Medicare Other

## 2016-09-10 DIAGNOSIS — I2510 Atherosclerotic heart disease of native coronary artery without angina pectoris: Secondary | ICD-10-CM | POA: Diagnosis not present

## 2016-09-10 DIAGNOSIS — E785 Hyperlipidemia, unspecified: Secondary | ICD-10-CM | POA: Diagnosis not present

## 2016-09-10 DIAGNOSIS — K219 Gastro-esophageal reflux disease without esophagitis: Secondary | ICD-10-CM | POA: Diagnosis not present

## 2016-09-10 DIAGNOSIS — I951 Orthostatic hypotension: Secondary | ICD-10-CM | POA: Diagnosis not present

## 2016-09-10 DIAGNOSIS — M6281 Muscle weakness (generalized): Secondary | ICD-10-CM | POA: Diagnosis not present

## 2016-09-10 DIAGNOSIS — J449 Chronic obstructive pulmonary disease, unspecified: Secondary | ICD-10-CM | POA: Diagnosis not present

## 2016-09-10 LAB — CBC WITH DIFFERENTIAL/PLATELET
BASOS ABS: 0 10*3/uL (ref 0.0–0.2)
Basos: 0 %
EOS (ABSOLUTE): 0.1 10*3/uL (ref 0.0–0.4)
EOS: 1 %
HEMATOCRIT: 27.6 % — AB (ref 37.5–51.0)
Hemoglobin: 8.8 g/dL — ABNORMAL LOW (ref 13.0–17.7)
IMMATURE GRANULOCYTES: 0 %
Immature Grans (Abs): 0 10*3/uL (ref 0.0–0.1)
LYMPHS ABS: 0.9 10*3/uL (ref 0.7–3.1)
Lymphs: 10 %
MCH: 27.9 pg (ref 26.6–33.0)
MCHC: 31.9 g/dL (ref 31.5–35.7)
MCV: 88 fL (ref 79–97)
MONOS ABS: 0.4 10*3/uL (ref 0.1–0.9)
Monocytes: 5 %
NEUTROS PCT: 84 %
Neutrophils Absolute: 7.2 10*3/uL — ABNORMAL HIGH (ref 1.4–7.0)
PLATELETS: 183 10*3/uL (ref 150–379)
RBC: 3.15 x10E6/uL — AB (ref 4.14–5.80)
RDW: 15.2 % (ref 12.3–15.4)
WBC: 8.6 10*3/uL (ref 3.4–10.8)

## 2016-09-10 LAB — BASIC METABOLIC PANEL
BUN/Creatinine Ratio: 12 (ref 10–24)
BUN: 18 mg/dL (ref 10–36)
CALCIUM: 9 mg/dL (ref 8.6–10.2)
CO2: 22 mmol/L (ref 18–29)
Chloride: 97 mmol/L (ref 96–106)
Creatinine, Ser: 1.55 mg/dL — ABNORMAL HIGH (ref 0.76–1.27)
GFR calc Af Amer: 44 mL/min/{1.73_m2} — ABNORMAL LOW (ref >59)
GFR calc non Af Amer: 38 mL/min/{1.73_m2} — ABNORMAL LOW (ref >59)
GLUCOSE: 117 mg/dL — AB (ref 65–99)
POTASSIUM: 4.3 mmol/L (ref 3.5–5.2)
SODIUM: 136 mmol/L (ref 134–144)

## 2016-09-12 ENCOUNTER — Encounter (HOSPITAL_COMMUNITY): Payer: Medicare Other

## 2016-09-12 DIAGNOSIS — K219 Gastro-esophageal reflux disease without esophagitis: Secondary | ICD-10-CM | POA: Diagnosis not present

## 2016-09-12 DIAGNOSIS — J449 Chronic obstructive pulmonary disease, unspecified: Secondary | ICD-10-CM | POA: Diagnosis not present

## 2016-09-12 DIAGNOSIS — E785 Hyperlipidemia, unspecified: Secondary | ICD-10-CM | POA: Diagnosis not present

## 2016-09-12 DIAGNOSIS — M6281 Muscle weakness (generalized): Secondary | ICD-10-CM | POA: Diagnosis not present

## 2016-09-12 DIAGNOSIS — I2510 Atherosclerotic heart disease of native coronary artery without angina pectoris: Secondary | ICD-10-CM | POA: Diagnosis not present

## 2016-09-12 DIAGNOSIS — I951 Orthostatic hypotension: Secondary | ICD-10-CM | POA: Diagnosis not present

## 2016-09-15 ENCOUNTER — Encounter (HOSPITAL_COMMUNITY): Payer: Medicare Other

## 2016-09-15 DIAGNOSIS — K219 Gastro-esophageal reflux disease without esophagitis: Secondary | ICD-10-CM | POA: Diagnosis not present

## 2016-09-15 DIAGNOSIS — M6281 Muscle weakness (generalized): Secondary | ICD-10-CM | POA: Diagnosis not present

## 2016-09-15 DIAGNOSIS — I2510 Atherosclerotic heart disease of native coronary artery without angina pectoris: Secondary | ICD-10-CM | POA: Diagnosis not present

## 2016-09-15 DIAGNOSIS — E785 Hyperlipidemia, unspecified: Secondary | ICD-10-CM | POA: Diagnosis not present

## 2016-09-15 DIAGNOSIS — I951 Orthostatic hypotension: Secondary | ICD-10-CM | POA: Diagnosis not present

## 2016-09-15 DIAGNOSIS — J449 Chronic obstructive pulmonary disease, unspecified: Secondary | ICD-10-CM | POA: Diagnosis not present

## 2016-09-17 ENCOUNTER — Encounter (HOSPITAL_COMMUNITY): Payer: Medicare Other

## 2016-09-17 DIAGNOSIS — K219 Gastro-esophageal reflux disease without esophagitis: Secondary | ICD-10-CM | POA: Diagnosis not present

## 2016-09-17 DIAGNOSIS — I2510 Atherosclerotic heart disease of native coronary artery without angina pectoris: Secondary | ICD-10-CM | POA: Diagnosis not present

## 2016-09-17 DIAGNOSIS — J449 Chronic obstructive pulmonary disease, unspecified: Secondary | ICD-10-CM | POA: Diagnosis not present

## 2016-09-17 DIAGNOSIS — E785 Hyperlipidemia, unspecified: Secondary | ICD-10-CM | POA: Diagnosis not present

## 2016-09-17 DIAGNOSIS — M6281 Muscle weakness (generalized): Secondary | ICD-10-CM | POA: Diagnosis not present

## 2016-09-17 DIAGNOSIS — I951 Orthostatic hypotension: Secondary | ICD-10-CM | POA: Diagnosis not present

## 2016-09-19 ENCOUNTER — Encounter (HOSPITAL_COMMUNITY): Payer: Medicare Other

## 2016-09-19 DIAGNOSIS — K219 Gastro-esophageal reflux disease without esophagitis: Secondary | ICD-10-CM | POA: Diagnosis not present

## 2016-09-19 DIAGNOSIS — M6281 Muscle weakness (generalized): Secondary | ICD-10-CM | POA: Diagnosis not present

## 2016-09-19 DIAGNOSIS — E785 Hyperlipidemia, unspecified: Secondary | ICD-10-CM | POA: Diagnosis not present

## 2016-09-19 DIAGNOSIS — J449 Chronic obstructive pulmonary disease, unspecified: Secondary | ICD-10-CM | POA: Diagnosis not present

## 2016-09-19 DIAGNOSIS — I2510 Atherosclerotic heart disease of native coronary artery without angina pectoris: Secondary | ICD-10-CM | POA: Diagnosis not present

## 2016-09-19 DIAGNOSIS — I951 Orthostatic hypotension: Secondary | ICD-10-CM | POA: Diagnosis not present

## 2016-09-22 ENCOUNTER — Encounter (HOSPITAL_COMMUNITY): Payer: Medicare Other

## 2016-09-24 ENCOUNTER — Encounter (HOSPITAL_COMMUNITY): Payer: Medicare Other

## 2016-09-25 DIAGNOSIS — M6281 Muscle weakness (generalized): Secondary | ICD-10-CM | POA: Diagnosis not present

## 2016-09-25 DIAGNOSIS — I951 Orthostatic hypotension: Secondary | ICD-10-CM | POA: Diagnosis not present

## 2016-09-25 DIAGNOSIS — K219 Gastro-esophageal reflux disease without esophagitis: Secondary | ICD-10-CM | POA: Diagnosis not present

## 2016-09-25 DIAGNOSIS — E785 Hyperlipidemia, unspecified: Secondary | ICD-10-CM | POA: Diagnosis not present

## 2016-09-25 DIAGNOSIS — J449 Chronic obstructive pulmonary disease, unspecified: Secondary | ICD-10-CM | POA: Diagnosis not present

## 2016-09-25 DIAGNOSIS — I2510 Atherosclerotic heart disease of native coronary artery without angina pectoris: Secondary | ICD-10-CM | POA: Diagnosis not present

## 2016-09-26 ENCOUNTER — Encounter (HOSPITAL_COMMUNITY): Payer: Medicare Other

## 2016-09-26 DIAGNOSIS — K219 Gastro-esophageal reflux disease without esophagitis: Secondary | ICD-10-CM | POA: Diagnosis not present

## 2016-09-26 DIAGNOSIS — J449 Chronic obstructive pulmonary disease, unspecified: Secondary | ICD-10-CM | POA: Diagnosis not present

## 2016-09-26 DIAGNOSIS — I2510 Atherosclerotic heart disease of native coronary artery without angina pectoris: Secondary | ICD-10-CM | POA: Diagnosis not present

## 2016-09-26 DIAGNOSIS — E785 Hyperlipidemia, unspecified: Secondary | ICD-10-CM | POA: Diagnosis not present

## 2016-09-26 DIAGNOSIS — M6281 Muscle weakness (generalized): Secondary | ICD-10-CM | POA: Diagnosis not present

## 2016-09-26 DIAGNOSIS — I951 Orthostatic hypotension: Secondary | ICD-10-CM | POA: Diagnosis not present

## 2016-09-29 ENCOUNTER — Encounter (HOSPITAL_COMMUNITY): Payer: Medicare Other

## 2016-09-29 DIAGNOSIS — J449 Chronic obstructive pulmonary disease, unspecified: Secondary | ICD-10-CM | POA: Diagnosis not present

## 2016-09-29 DIAGNOSIS — I951 Orthostatic hypotension: Secondary | ICD-10-CM | POA: Diagnosis not present

## 2016-09-29 DIAGNOSIS — E785 Hyperlipidemia, unspecified: Secondary | ICD-10-CM | POA: Diagnosis not present

## 2016-09-29 DIAGNOSIS — K219 Gastro-esophageal reflux disease without esophagitis: Secondary | ICD-10-CM | POA: Diagnosis not present

## 2016-09-29 DIAGNOSIS — M6281 Muscle weakness (generalized): Secondary | ICD-10-CM | POA: Diagnosis not present

## 2016-09-29 DIAGNOSIS — I2510 Atherosclerotic heart disease of native coronary artery without angina pectoris: Secondary | ICD-10-CM | POA: Diagnosis not present

## 2016-10-01 ENCOUNTER — Encounter (HOSPITAL_COMMUNITY): Payer: Medicare Other

## 2016-10-03 ENCOUNTER — Encounter (HOSPITAL_COMMUNITY): Payer: Medicare Other

## 2016-10-06 ENCOUNTER — Encounter (HOSPITAL_COMMUNITY): Payer: Medicare Other

## 2016-10-08 DIAGNOSIS — J449 Chronic obstructive pulmonary disease, unspecified: Secondary | ICD-10-CM | POA: Diagnosis not present

## 2016-10-08 DIAGNOSIS — K219 Gastro-esophageal reflux disease without esophagitis: Secondary | ICD-10-CM | POA: Diagnosis not present

## 2016-10-08 DIAGNOSIS — I951 Orthostatic hypotension: Secondary | ICD-10-CM | POA: Diagnosis not present

## 2016-10-08 DIAGNOSIS — M6281 Muscle weakness (generalized): Secondary | ICD-10-CM | POA: Diagnosis not present

## 2016-10-08 DIAGNOSIS — I2510 Atherosclerotic heart disease of native coronary artery without angina pectoris: Secondary | ICD-10-CM | POA: Diagnosis not present

## 2016-10-08 DIAGNOSIS — E785 Hyperlipidemia, unspecified: Secondary | ICD-10-CM | POA: Diagnosis not present

## 2016-10-13 DIAGNOSIS — Z299 Encounter for prophylactic measures, unspecified: Secondary | ICD-10-CM | POA: Diagnosis not present

## 2016-10-13 DIAGNOSIS — J439 Emphysema, unspecified: Secondary | ICD-10-CM | POA: Diagnosis not present

## 2016-10-13 DIAGNOSIS — I5032 Chronic diastolic (congestive) heart failure: Secondary | ICD-10-CM | POA: Diagnosis not present

## 2016-10-13 DIAGNOSIS — E039 Hypothyroidism, unspecified: Secondary | ICD-10-CM | POA: Diagnosis not present

## 2016-10-13 DIAGNOSIS — I714 Abdominal aortic aneurysm, without rupture: Secondary | ICD-10-CM | POA: Diagnosis not present

## 2016-10-13 DIAGNOSIS — E7800 Pure hypercholesterolemia, unspecified: Secondary | ICD-10-CM | POA: Diagnosis not present

## 2016-10-13 DIAGNOSIS — K219 Gastro-esophageal reflux disease without esophagitis: Secondary | ICD-10-CM | POA: Diagnosis not present

## 2016-10-13 DIAGNOSIS — Z952 Presence of prosthetic heart valve: Secondary | ICD-10-CM | POA: Diagnosis not present

## 2016-10-13 DIAGNOSIS — J449 Chronic obstructive pulmonary disease, unspecified: Secondary | ICD-10-CM | POA: Diagnosis not present

## 2016-10-13 DIAGNOSIS — I259 Chronic ischemic heart disease, unspecified: Secondary | ICD-10-CM | POA: Diagnosis not present

## 2016-10-13 DIAGNOSIS — Z6825 Body mass index (BMI) 25.0-25.9, adult: Secondary | ICD-10-CM | POA: Diagnosis not present

## 2016-10-13 DIAGNOSIS — J849 Interstitial pulmonary disease, unspecified: Secondary | ICD-10-CM | POA: Diagnosis not present

## 2016-11-05 ENCOUNTER — Ambulatory Visit: Payer: Medicare Other | Admitting: Cardiovascular Disease

## 2016-12-01 ENCOUNTER — Telehealth: Payer: Self-pay | Admitting: Cardiovascular Disease

## 2016-12-01 ENCOUNTER — Ambulatory Visit (INDEPENDENT_AMBULATORY_CARE_PROVIDER_SITE_OTHER): Payer: Medicare Other | Admitting: Cardiovascular Disease

## 2016-12-01 ENCOUNTER — Encounter: Payer: Self-pay | Admitting: Cardiovascular Disease

## 2016-12-01 VITALS — BP 112/53 | HR 75 | Ht 70.0 in | Wt 160.0 lb

## 2016-12-01 DIAGNOSIS — Z952 Presence of prosthetic heart valve: Secondary | ICD-10-CM | POA: Diagnosis not present

## 2016-12-01 DIAGNOSIS — I25118 Atherosclerotic heart disease of native coronary artery with other forms of angina pectoris: Secondary | ICD-10-CM

## 2016-12-01 DIAGNOSIS — I714 Abdominal aortic aneurysm, without rupture, unspecified: Secondary | ICD-10-CM

## 2016-12-01 DIAGNOSIS — I209 Angina pectoris, unspecified: Secondary | ICD-10-CM

## 2016-12-01 DIAGNOSIS — D649 Anemia, unspecified: Secondary | ICD-10-CM

## 2016-12-01 NOTE — Telephone Encounter (Signed)
AAA - AAA Scheduled in [facility] Dec 17, 2016

## 2016-12-01 NOTE — Addendum Note (Signed)
Addended by: Laurine Blazer on: 12/01/2016 11:23 AM   Modules accepted: Orders

## 2016-12-01 NOTE — Progress Notes (Signed)
SUBJECTIVE: The patient presents for follow-up of hypertensive heart disease, coronary artery disease, and aortic stenosis s/p TAVR ([facility] 2018).  He has interstitial lung disease with emphysema and connective tissue disease. He sleeps with 4 L of oxygen and otherwise uses 6 L when active.  His chronic exertional dyspnea is stable. He denies hematochezia and melena. He denies chest pain. Hemoglobin 8.8 on 09/09/16.    Soc: Served in the [place] [employer] on the [employer] in [place]. Married. They have 2 sons. One is a retired Theme park manager IT sales professional) in [place], [place]. Another son is retired from missions.   Review of Systems: As per "subjective", otherwise negative.  No Known Allergies  Current Outpatient Prescriptions  Medication Sig Dispense Refill  . acetaminophen (TYLENOL) 325 MG tablet Take 650 mg by mouth every 6 (six) hours as needed.    Marland Kitchen alfuzosin (UROXATRAL) 10 MG 24 hr tablet Take 10 mg by mouth daily with breakfast.    . aspirin EC 81 MG tablet Take 1 tablet (81 mg total) by mouth daily.    Marland Kitchen atorvastatin (LIPITOR) 80 MG tablet Take 80 mg by mouth daily at 6 PM.    . calcium carbonate (OSCAL) 1500 (600 Ca) MG TABS tablet Take 1,500 mg by mouth 3 (three) times daily with meals.    . cyanocobalamin 500 MCG tablet Take 500 mcg by mouth daily.    . fluticasone (FLONASE) 50 MCG/ACT nasal spray Place 1 spray into both nostrils 2 (two) times daily.    . furosemide (LASIX) 20 MG tablet Take 1 tablet (20 mg total) by mouth daily. (09/04/16 HOLDING TILL NEXT OV IN 2 MONTHS)    . ipratropium-albuterol (DUONEB) 0.5-2.5 (3) MG/3ML SOLN Inhale 3 mLs into the lungs every 4 (four) hours as needed.  0  . levothyroxine (SYNTHROID, LEVOTHROID) 125 MCG tablet Take 62.5 mcg by mouth daily before breakfast.     . Multiple Vitamin (MULTIVITAMIN) capsule Take 1 capsule by mouth daily.    . mycophenolate (CELLCEPT) 500 MG tablet Take 1,000 mg by mouth 2 (two) times daily. Takes 2 tablets in the morning and 1  tablet in the evening    . Omega-3 Fatty Acids (FISH OIL PO) Take 2 tablets by mouth daily.     . OXYGEN-HELIUM IN 3-5 L.     . pantoprazole (PROTONIX) 40 MG tablet Take 40 mg by mouth daily.    . predniSONE (DELTASONE) 10 MG tablet Take 10 mg by mouth daily with breakfast.    . risedronate (ACTONEL) 35 MG tablet Take 35 mg by mouth every 7 (seven) days. with water on empty stomach, nothing by mouth or lie down for next 30 minutes.    . sulfamethoxazole-trimethoprim (BACTRIM DS,SEPTRA DS) 800-160 MG tablet Take 1 tablet by mouth 3 (three) times a week.     No current facility-administered medications for this visit.     Past Medical History:  Diagnosis Date  . AAA (abdominal aortic aneurysm) ([place])    Doppler (Dr Trena Platt office 07/2010...stable 3.2)  . Carotid artery disease (HCC)    Doppler, Dr. Manuella Ghazi office, June, 2012, report scanned to this EMR, less than 50% bilateral  . Connective tissue disease ([place])    followed at [facility] interstitial lung clinic  . COPD (chronic obstructive pulmonary disease) ([place])   . Ejection fraction    Normal ejection fraction, echo, October 14, 2010,  Dr Manuella Ghazi office,, reports scan the disc E. MR  . Hyperlipidemia   . Hypothyroidism   .  Interstitial lung disease ([place])    followed at [facility]  . Severe aortic stenosis 07/02/2010   Qualifier: Diagnosis of  By: Ron Parker, MD, [facility], [place]     Past Surgical History:  Procedure Laterality Date  . COLONOSCOPY  1999   Dr. Lindalou Hose, reportedly had anal stenosis   . COLONOSCOPY  2010   ?normal per PCP records  . HERNIA REPAIR Bilateral    bilateral inguinal hernia repair  . LEFT AND RIGHT HEART CATHETERIZATION WITH CORONARY ANGIOGRAM N/A 12/15/2013   Procedure: LEFT AND RIGHT HEART CATHETERIZATION WITH CORONARY ANGIOGRAM;  Surgeon: Blane Ohara, MD;  Location: [facility] CATH LAB;  Service: Cardiovascular;  Laterality: N/A;  . PARTIAL TURP  03/01/2014   DR. BAUER    Social History   Social History  . Marital status:  Married    Spouse name: N/A  . Number of children: N/A  . Years of education: N/A   Occupational History  . Not on file.   Social History Main Topics  . Smoking status: Former Smoker    Packs/day: 1.00    Years: 25.00    Types: Cigarettes    Start date: 02/18/1940    Quit date: 12/09/1964  . Smokeless tobacco: Never Used  . Alcohol use No  . Drug use: No  . Sexual activity: Not on file   Other Topics Concern  . Not on file   Social History Narrative  . No narrative on file     Vitals:   12/01/16 1044  BP: (!) 112/53  Pulse: 75  SpO2: 98%  Weight: 160 lb (72.6 kg)  Height: 5\' 10"  (1.778 m)    Wt Readings from Last 3 Encounters:  12/01/16 160 lb (72.6 kg)  09/04/16 166 lb (75.3 kg)  07/11/16 171 lb 8.3 oz (77.8 kg)     PHYSICAL EXAM General: NAD, using oxygen by nasal cannula  Neck: No JVD, no thyromegaly or thyroid nodule.  Lungs: Diffuse dry crackles, no wheezes.  CV: Nondisplaced PMI. Regular rate and irregular rhythm, normal S1/S2, no D6/L8, 2/6 systolic murmur over RUSB. No peripheral edema. Abdomen: Soft, no distention.  Skin: Intact without lesions or rashes.  Neurologic: Alert and oriented x 3.  Psych: Normal affect.  Skin: Normal. Musculoskeletal: No gross deformities.    ECG: Most recent ECG reviewed.   Labs: Lab Results  Component Value Date/Time   K 4.3 09/09/2016 04:28 PM   BUN 18 09/09/2016 04:28 PM   CREATININE 1.55 (H) 09/09/2016 04:28 PM   HGB 8.8 (L) 09/09/2016 04:28 PM     Lipids: No results found for: LDLCALC, LDLDIRECT, CHOL, TRIG, HDL     ASSESSMENT AND PLAN:  1. Aortic stenosis s/p TAVR: He appears to be stable. Continue ASA.  2. CAD: Symptomatically stable. Continue aspirin and Lipitor.  3. AAA: I will obtain an abdominal aortic ultrasound.  4. Anemia: Hemoglobin 8.8 on 09/09/16. I will repeat a CBC.    Disposition: Follow up 6 months.   Kate Sable, M.D., F.A.C.C.

## 2016-12-01 NOTE — Patient Instructions (Signed)
Medication Instructions:  Continue all current medications.  Labwork:  CBC - order given today.  Office will contact with results via phone or letter.    Testing/Procedures:  Your physician has requested that you have an abdominal aorta duplex. During this test, an ultrasound is used to evaluate the aorta. Allow 30 minutes for this exam. Do not eat after midnight the day before and avoid carbonated beverages  Office will contact with results via phone or letter.    Follow-Up: Your physician wants you to follow up in: 6 months.  You will receive a reminder letter in the mail one-two months in advance.  If you don't receive a letter, please call our office to schedule the follow up appointment   Any Other Special Instructions Will Be Listed Below (If Applicable).  If you need a refill on your cardiac medications before your next appointment, please call your pharmacy.

## 2016-12-04 DIAGNOSIS — R0902 Hypoxemia: Secondary | ICD-10-CM | POA: Diagnosis not present

## 2016-12-04 DIAGNOSIS — J438 Other emphysema: Secondary | ICD-10-CM | POA: Diagnosis not present

## 2016-12-04 DIAGNOSIS — R918 Other nonspecific abnormal finding of lung field: Secondary | ICD-10-CM | POA: Diagnosis not present

## 2016-12-04 DIAGNOSIS — J84112 Idiopathic pulmonary fibrosis: Secondary | ICD-10-CM | POA: Diagnosis not present

## 2016-12-04 DIAGNOSIS — I2510 Atherosclerotic heart disease of native coronary artery without angina pectoris: Secondary | ICD-10-CM | POA: Diagnosis not present

## 2016-12-04 DIAGNOSIS — I714 Abdominal aortic aneurysm, without rupture: Secondary | ICD-10-CM | POA: Diagnosis not present

## 2016-12-04 DIAGNOSIS — Z8719 Personal history of other diseases of the digestive system: Secondary | ICD-10-CM | POA: Diagnosis not present

## 2016-12-04 DIAGNOSIS — R911 Solitary pulmonary nodule: Secondary | ICD-10-CM | POA: Diagnosis not present

## 2016-12-04 DIAGNOSIS — Z79899 Other long term (current) drug therapy: Secondary | ICD-10-CM | POA: Diagnosis not present

## 2016-12-04 DIAGNOSIS — Z9889 Other specified postprocedural states: Secondary | ICD-10-CM | POA: Diagnosis not present

## 2016-12-04 DIAGNOSIS — N183 Chronic kidney disease, stage 3 (moderate): Secondary | ICD-10-CM | POA: Diagnosis not present

## 2016-12-05 ENCOUNTER — Encounter: Payer: Self-pay | Admitting: *Deleted

## 2016-12-11 DIAGNOSIS — J849 Interstitial pulmonary disease, unspecified: Secondary | ICD-10-CM | POA: Diagnosis not present

## 2016-12-11 DIAGNOSIS — Z299 Encounter for prophylactic measures, unspecified: Secondary | ICD-10-CM | POA: Diagnosis not present

## 2016-12-11 DIAGNOSIS — J449 Chronic obstructive pulmonary disease, unspecified: Secondary | ICD-10-CM | POA: Diagnosis not present

## 2016-12-11 DIAGNOSIS — Z6825 Body mass index (BMI) 25.0-25.9, adult: Secondary | ICD-10-CM | POA: Diagnosis not present

## 2016-12-11 DIAGNOSIS — I714 Abdominal aortic aneurysm, without rupture: Secondary | ICD-10-CM | POA: Diagnosis not present

## 2016-12-11 DIAGNOSIS — N183 Chronic kidney disease, stage 3 (moderate): Secondary | ICD-10-CM | POA: Diagnosis not present

## 2016-12-17 ENCOUNTER — Ambulatory Visit: Payer: Medicare Other

## 2016-12-17 DIAGNOSIS — I714 Abdominal aortic aneurysm, without rupture, unspecified: Secondary | ICD-10-CM

## 2016-12-17 DIAGNOSIS — I700 Atherosclerosis of aorta: Secondary | ICD-10-CM | POA: Diagnosis not present

## 2016-12-23 ENCOUNTER — Telehealth: Payer: Self-pay | Admitting: *Deleted

## 2016-12-23 NOTE — Telephone Encounter (Signed)
Notes recorded by Laurine Blazer, LPN on 1/83/4373 at 5:78 PM EDT Patient notified. Copy to pmd. ------  Notes recorded by Herminio Commons, MD on 12/18/2016 at 3:51 PM EDT Stable AAA. Repeat in one year.

## 2017-01-06 DIAGNOSIS — Z952 Presence of prosthetic heart valve: Secondary | ICD-10-CM | POA: Diagnosis not present

## 2017-01-06 DIAGNOSIS — Z713 Dietary counseling and surveillance: Secondary | ICD-10-CM | POA: Diagnosis not present

## 2017-01-06 DIAGNOSIS — Z299 Encounter for prophylactic measures, unspecified: Secondary | ICD-10-CM | POA: Diagnosis not present

## 2017-01-06 DIAGNOSIS — L03119 Cellulitis of unspecified part of limb: Secondary | ICD-10-CM | POA: Diagnosis not present

## 2017-01-06 DIAGNOSIS — Z6825 Body mass index (BMI) 25.0-25.9, adult: Secondary | ICD-10-CM | POA: Diagnosis not present

## 2017-01-21 DIAGNOSIS — Z6826 Body mass index (BMI) 26.0-26.9, adult: Secondary | ICD-10-CM | POA: Diagnosis not present

## 2017-01-21 DIAGNOSIS — J849 Interstitial pulmonary disease, unspecified: Secondary | ICD-10-CM | POA: Diagnosis not present

## 2017-01-21 DIAGNOSIS — I259 Chronic ischemic heart disease, unspecified: Secondary | ICD-10-CM | POA: Diagnosis not present

## 2017-01-21 DIAGNOSIS — Z299 Encounter for prophylactic measures, unspecified: Secondary | ICD-10-CM | POA: Diagnosis not present

## 2017-01-21 DIAGNOSIS — E7800 Pure hypercholesterolemia, unspecified: Secondary | ICD-10-CM | POA: Diagnosis not present

## 2017-01-21 DIAGNOSIS — E039 Hypothyroidism, unspecified: Secondary | ICD-10-CM | POA: Diagnosis not present

## 2017-01-21 DIAGNOSIS — I2510 Atherosclerotic heart disease of native coronary artery without angina pectoris: Secondary | ICD-10-CM | POA: Diagnosis not present

## 2017-01-21 DIAGNOSIS — I714 Abdominal aortic aneurysm, without rupture: Secondary | ICD-10-CM | POA: Diagnosis not present

## 2017-01-21 DIAGNOSIS — I5032 Chronic diastolic (congestive) heart failure: Secondary | ICD-10-CM | POA: Diagnosis not present

## 2017-01-21 DIAGNOSIS — N400 Enlarged prostate without lower urinary tract symptoms: Secondary | ICD-10-CM | POA: Diagnosis not present

## 2017-01-21 DIAGNOSIS — J449 Chronic obstructive pulmonary disease, unspecified: Secondary | ICD-10-CM | POA: Diagnosis not present

## 2017-01-21 DIAGNOSIS — N183 Chronic kidney disease, stage 3 (moderate): Secondary | ICD-10-CM | POA: Diagnosis not present

## 2017-02-03 DIAGNOSIS — Z23 Encounter for immunization: Secondary | ICD-10-CM | POA: Diagnosis not present

## 2017-02-20 DIAGNOSIS — Z6826 Body mass index (BMI) 26.0-26.9, adult: Secondary | ICD-10-CM | POA: Diagnosis not present

## 2017-02-20 DIAGNOSIS — Z1339 Encounter for screening examination for other mental health and behavioral disorders: Secondary | ICD-10-CM | POA: Diagnosis not present

## 2017-02-20 DIAGNOSIS — Z1211 Encounter for screening for malignant neoplasm of colon: Secondary | ICD-10-CM | POA: Diagnosis not present

## 2017-02-20 DIAGNOSIS — Z1331 Encounter for screening for depression: Secondary | ICD-10-CM | POA: Diagnosis not present

## 2017-02-20 DIAGNOSIS — I5032 Chronic diastolic (congestive) heart failure: Secondary | ICD-10-CM | POA: Diagnosis not present

## 2017-02-20 DIAGNOSIS — J849 Interstitial pulmonary disease, unspecified: Secondary | ICD-10-CM | POA: Diagnosis not present

## 2017-02-20 DIAGNOSIS — J449 Chronic obstructive pulmonary disease, unspecified: Secondary | ICD-10-CM | POA: Diagnosis not present

## 2017-02-20 DIAGNOSIS — Z7189 Other specified counseling: Secondary | ICD-10-CM | POA: Diagnosis not present

## 2017-02-20 DIAGNOSIS — Z299 Encounter for prophylactic measures, unspecified: Secondary | ICD-10-CM | POA: Diagnosis not present

## 2017-02-20 DIAGNOSIS — E7800 Pure hypercholesterolemia, unspecified: Secondary | ICD-10-CM | POA: Diagnosis not present

## 2017-02-20 DIAGNOSIS — Z0000 Encounter for general adult medical examination without abnormal findings: Secondary | ICD-10-CM | POA: Diagnosis not present

## 2017-02-20 DIAGNOSIS — N183 Chronic kidney disease, stage 3 (moderate): Secondary | ICD-10-CM | POA: Diagnosis not present

## 2017-02-24 DIAGNOSIS — E039 Hypothyroidism, unspecified: Secondary | ICD-10-CM | POA: Diagnosis not present

## 2017-02-24 DIAGNOSIS — R5383 Other fatigue: Secondary | ICD-10-CM | POA: Diagnosis not present

## 2017-02-24 DIAGNOSIS — E7800 Pure hypercholesterolemia, unspecified: Secondary | ICD-10-CM | POA: Diagnosis not present

## 2017-02-24 DIAGNOSIS — Z125 Encounter for screening for malignant neoplasm of prostate: Secondary | ICD-10-CM | POA: Diagnosis not present

## 2017-02-24 DIAGNOSIS — Z79899 Other long term (current) drug therapy: Secondary | ICD-10-CM | POA: Diagnosis not present

## 2017-03-23 DIAGNOSIS — L820 Inflamed seborrheic keratosis: Secondary | ICD-10-CM | POA: Diagnosis not present

## 2017-03-23 DIAGNOSIS — L570 Actinic keratosis: Secondary | ICD-10-CM | POA: Diagnosis not present

## 2017-03-23 DIAGNOSIS — C4442 Squamous cell carcinoma of skin of scalp and neck: Secondary | ICD-10-CM | POA: Diagnosis not present

## 2017-05-25 DIAGNOSIS — N400 Enlarged prostate without lower urinary tract symptoms: Secondary | ICD-10-CM | POA: Diagnosis not present

## 2017-05-25 DIAGNOSIS — I714 Abdominal aortic aneurysm, without rupture: Secondary | ICD-10-CM | POA: Diagnosis not present

## 2017-05-25 DIAGNOSIS — J449 Chronic obstructive pulmonary disease, unspecified: Secondary | ICD-10-CM | POA: Diagnosis not present

## 2017-05-25 DIAGNOSIS — Z6826 Body mass index (BMI) 26.0-26.9, adult: Secondary | ICD-10-CM | POA: Diagnosis not present

## 2017-05-25 DIAGNOSIS — Z87891 Personal history of nicotine dependence: Secondary | ICD-10-CM | POA: Diagnosis not present

## 2017-05-25 DIAGNOSIS — Z299 Encounter for prophylactic measures, unspecified: Secondary | ICD-10-CM | POA: Diagnosis not present

## 2017-05-25 DIAGNOSIS — I5032 Chronic diastolic (congestive) heart failure: Secondary | ICD-10-CM | POA: Diagnosis not present

## 2017-05-25 DIAGNOSIS — N183 Chronic kidney disease, stage 3 (moderate): Secondary | ICD-10-CM | POA: Diagnosis not present

## 2017-05-25 DIAGNOSIS — J849 Interstitial pulmonary disease, unspecified: Secondary | ICD-10-CM | POA: Diagnosis not present

## 2017-09-03 DIAGNOSIS — I259 Chronic ischemic heart disease, unspecified: Secondary | ICD-10-CM | POA: Diagnosis not present

## 2017-09-03 DIAGNOSIS — R1310 Dysphagia, unspecified: Secondary | ICD-10-CM | POA: Diagnosis not present

## 2017-09-03 DIAGNOSIS — Z299 Encounter for prophylactic measures, unspecified: Secondary | ICD-10-CM | POA: Diagnosis not present

## 2017-09-03 DIAGNOSIS — Z952 Presence of prosthetic heart valve: Secondary | ICD-10-CM | POA: Diagnosis not present

## 2017-09-03 DIAGNOSIS — E7800 Pure hypercholesterolemia, unspecified: Secondary | ICD-10-CM | POA: Diagnosis not present

## 2017-09-03 DIAGNOSIS — Z6826 Body mass index (BMI) 26.0-26.9, adult: Secondary | ICD-10-CM | POA: Diagnosis not present

## 2017-09-03 DIAGNOSIS — J449 Chronic obstructive pulmonary disease, unspecified: Secondary | ICD-10-CM | POA: Diagnosis not present

## 2017-09-04 ENCOUNTER — Encounter: Payer: Self-pay | Admitting: Internal Medicine

## 2017-11-03 DIAGNOSIS — J849 Interstitial pulmonary disease, unspecified: Secondary | ICD-10-CM | POA: Diagnosis not present

## 2017-11-03 DIAGNOSIS — Z299 Encounter for prophylactic measures, unspecified: Secondary | ICD-10-CM | POA: Diagnosis not present

## 2017-11-03 DIAGNOSIS — N183 Chronic kidney disease, stage 3 (moderate): Secondary | ICD-10-CM | POA: Diagnosis not present

## 2017-11-03 DIAGNOSIS — J449 Chronic obstructive pulmonary disease, unspecified: Secondary | ICD-10-CM | POA: Diagnosis not present

## 2017-11-03 DIAGNOSIS — R1310 Dysphagia, unspecified: Secondary | ICD-10-CM | POA: Diagnosis not present

## 2017-11-03 DIAGNOSIS — Z6826 Body mass index (BMI) 26.0-26.9, adult: Secondary | ICD-10-CM | POA: Diagnosis not present

## 2017-11-25 ENCOUNTER — Ambulatory Visit: Payer: Medicare Other | Admitting: Gastroenterology

## 2017-12-07 ENCOUNTER — Ambulatory Visit (INDEPENDENT_AMBULATORY_CARE_PROVIDER_SITE_OTHER): Payer: Medicare Other | Admitting: Gastroenterology

## 2017-12-07 ENCOUNTER — Encounter: Payer: Self-pay | Admitting: Gastroenterology

## 2017-12-07 ENCOUNTER — Telehealth: Payer: Self-pay

## 2017-12-07 DIAGNOSIS — R1310 Dysphagia, unspecified: Secondary | ICD-10-CM

## 2017-12-07 NOTE — Assessment & Plan Note (Addendum)
84 year old very pleasant male with vague pill dysphagia but no solid food dysphagia. Appetite is good, no other alarm symptoms. Highly suspect he has an underlying age-related dysmotility. EGD on file from last year at [facility] at time of upper GI bleed with AVM but no other alarming features. Discussed pursuing BPE and changing large calcium pill to chewable, taking pills one at a time with applesauce, crushing/dividing as appropriate. He does not want to pursue BPE right now and only would like to pursue behavior modifications, which we will do. I would not want to pursue EGD due to other medical issues unless he was unable to tolerate his diet or had worsening of symptoms; if this were to happen, would pursue BPE first. Patient and son both agreeable. Progress report in 2 weeks.

## 2017-12-07 NOTE — Progress Notes (Signed)
Primary Care Physician:  Monico Blitz, MD Primary Gastroenterologist:  Dr. Gala Romney   Chief Complaint  Patient presents with  . Dysphagia    pills    HPI:   Rodney Bowman is a 84 y.o. male presenting today at the request of Dr. Manuella Ghazi secondary to pill dysphagia. Remote colonoscopy in 2010 normal. EGD in remote past with distal esophageal stricture s/p dilation. Most recently was inpatient at [facility] with upper GI bleed in setting of aspirin and plavix, with EGD revealing AVM along curvature of stomach s/p cautery and hemoclip placement.   His son, Wane Mollett, is present with him. Both sons with doctorates of divinity. Patient still lives at home with his wife who requires a caretaker. His main concern is that at some point, he will not be able to vocalize how he needs to eat/take his pills and that he will end up choking.   Denies any solid food dysphagia whatsoever. Large calcium pills feel difficult at times to swallow, so he is halving these. In the morning, takes pills with coffee. In the evening with applesauce. Drinks water behind pills. If he takes his time, he does well. His son states he had only one incident that scared him, and since that time he has been nervous about his pills. He does not want to pursue endoscopy unless absolutely necessary.        Past Medical History:  Diagnosis Date  . AAA (abdominal aortic aneurysm) ([place])    Doppler (Dr Trena Platt office 07/2010...stable 3.2)  . Carotid artery disease (HCC)    Doppler, Dr. Manuella Ghazi office, June, 2012, report scanned to this EMR, less than 50% bilateral  . Connective tissue disease ([place])    followed at [facility] interstitial lung clinic  . COPD (chronic obstructive pulmonary disease) ([place])   . Ejection fraction    Normal ejection fraction, echo, October 14, 2010,  Dr Manuella Ghazi office,, reports scan the disc E. MR  . Hyperlipidemia   . Hypothyroidism   . Interstitial lung disease ([place])    followed at [facility]  . Severe aortic stenosis  07/02/2010   Qualifier: Diagnosis of  By: Ron Parker, MD, [facility], Dorinda Hill     Past Surgical History:  Procedure Laterality Date  . COLONOSCOPY  1999   Dr. Lindalou Hose, reportedly had anal stenosis   . COLONOSCOPY  2010   ?normal per PCP records  . ESOPHAGOGASTRODUODENOSCOPY  2003   [facility]: stricture in distal esophagus, s/p dilation with Savary dilation. 2 cm hiatal hernia  . ESOPHAGOGASTRODUODENOSCOPY  06/2016   [facility]: on aspirin and plavix. AVM along curvature of stomach s/p cautery and hemoclip placement  . HERNIA REPAIR Bilateral    bilateral inguinal hernia repair  . LEFT AND RIGHT HEART CATHETERIZATION WITH CORONARY ANGIOGRAM N/A 12/15/2013   Procedure: LEFT AND RIGHT HEART CATHETERIZATION WITH CORONARY ANGIOGRAM;  Surgeon: Blane Ohara, MD;  Location: [facility] CATH LAB;  Service: Cardiovascular;  Laterality: N/A;  . PARTIAL TURP  03/01/2014   DR. BAUER   PATIENT IS NOT ENTIRELY SURE ABOUT MEDICATION LIST Current Outpatient Medications  Medication Sig Dispense Refill  . acetaminophen (TYLENOL) 325 MG tablet Take 650 mg by mouth every 6 (six) hours as needed.    Marland Kitchen alfuzosin (UROXATRAL) 10 MG 24 hr tablet Take 10 mg by mouth daily with breakfast.    . aspirin EC 81 MG tablet Take 1 tablet (81 mg total) by mouth daily.    Marland Kitchen atorvastatin (LIPITOR) 80 MG tablet Take  80 mg by mouth daily at 6 PM.    . calcium carbonate (OSCAL) 1500 (600 Ca) MG TABS tablet Take 1,500 mg by mouth 3 (three) times daily with meals.    . cyanocobalamin 500 MCG tablet Take 500 mcg by mouth daily.    . finasteride (PROSCAR) 5 MG tablet Take 1 tablet by mouth daily.    . fluticasone (FLONASE) 50 MCG/ACT nasal spray Place 1 spray into both nostrils 2 (two) times daily.    . furosemide (LASIX) 20 MG tablet Take 1 tablet (20 mg total) by mouth daily. (09/04/16 HOLDING TILL NEXT OV IN 2 MONTHS)    . ipratropium-albuterol (DUONEB) 0.5-2.5 (3) MG/3ML SOLN Inhale 3 mLs into the lungs every 4 (four) hours as needed.  0  .  levothyroxine (SYNTHROID, LEVOTHROID) 125 MCG tablet Take 62.5 mcg by mouth daily before breakfast.     . Multiple Vitamin (MULTIVITAMIN) capsule Take 1 capsule by mouth daily.    . mycophenolate (CELLCEPT) 500 MG tablet Take 1,000 mg by mouth 2 (two) times daily. Takes 2 tablets in the morning and 1 tablet in the evening    . Omega-3 Fatty Acids (FISH OIL PO) Take 2 tablets by mouth daily.     . OXYGEN-HELIUM IN 3-5 L.     . predniSONE (DELTASONE) 10 MG tablet Take 10 mg by mouth daily with breakfast.    . risedronate (ACTONEL) 35 MG tablet Take 35 mg by mouth every 7 (seven) days. with water on empty stomach, nothing by mouth or lie down for next 30 minutes.    . sulfamethoxazole-trimethoprim (BACTRIM DS,SEPTRA DS) 800-160 MG tablet Take 1 tablet by mouth 3 (three) times a week.    . clopidogrel (PLAVIX) 75 MG tablet Take 1 tablet by mouth daily.  1  . pantoprazole (PROTONIX) 40 MG tablet Take 40 mg by mouth daily.     No current facility-administered medications for this visit.     Allergies as of 12/07/2017  . (No Known Allergies)    Family History  Problem Relation Age of Onset  . Heart attack Father   . Pancreatic cancer Mother   . Cancer Brother   . Colon cancer Neg Hx     Social History   Socioeconomic History  . Marital status: Married    Spouse name: Not on file  . Number of children: Not on file  . Years of education: Not on file  . Highest education level: Not on file  Occupational History  . Not on file  Social Needs  . Financial resource strain: Not on file  . Food insecurity:    Worry: Not on file    Inability: Not on file  . Transportation needs:    Medical: Not on file    Non-medical: Not on file  Tobacco Use  . Smoking status: Former Smoker    Packs/day: 1.00    Years: 25.00    Pack years: 25.00    Types: Cigarettes    Start date: 02/18/1940    Last attempt to quit: 12/09/1964    Years since quitting: 53.0  . Smokeless tobacco: Never Used    Substance and Sexual Activity  . Alcohol use: No    Alcohol/week: 0.0 standard drinks  . Drug use: No  . Sexual activity: Not on file  Lifestyle  . Physical activity:    Days per week: Not on file    Minutes per session: Not on file  . Stress: Not on file  Relationships  .  Social connections:    Talks on phone: Not on file    Gets together: Not on file    Attends religious service: Not on file    Active member of club or organization: Not on file    Attends meetings of clubs or organizations: Not on file    Relationship status: Not on file  . Intimate partner violence:    Fear of current or ex partner: Not on file    Emotionally abused: Not on file    Physically abused: Not on file    Forced sexual activity: Not on file  Other Topics Concern  . Not on file  Social History Narrative  . Not on file    Review of Systems: Gen: Denies any fever, chills, fatigue, weight loss, lack of appetite.  CV: Denies chest pain, heart palpitations, peripheral edema, syncope.  Resp: +DOE  GI: see HPI  GU : Denies urinary burning, urinary frequency, urinary hesitancy MS: age-related aches, difficult to walk more than a few feet, wheelchair for longer distances  Derm: Denies rash, itching, dry skin Psych: Denies depression, anxiety, memory loss, and confusion Heme: Denies bruising, bleeding, and enlarged lymph nodes.  Physical Exam: BP (!) 95/54   Pulse 63   Temp (!) 96.7 F (35.9 C) (Oral)   Ht 5\' 10"  (1.778 m)   Wt 154 lb 6.4 oz (70 kg)   BMI 22.15 kg/m  General:   Alert and oriented. Pleasant and cooperative. Well-nourished and well-developed.  Head:  Normocephalic and atraumatic. Eyes:  Without icterus, sclera clear and conjunctiva pink.  Ears:  Hard of hearing, hearing aids present  Nose:  No deformity, discharge,  or lesions. Lungs:  Clear to auscultation bilaterally.  Heart:  S1, S2 present with soft systolic murmur  Abdomen:  +BS, soft, non-tender and non-distended. No  HSM noted. No guarding or rebound. Limited exam with patient in wheelchair but no significant abnormalities  Rectal:  Deferred  Msk:  Kyphosis  Extremities:  Without  edema. Neurologic:  Alert and  oriented x4; Psych:  Alert and cooperative. Normal mood and affect.

## 2017-12-07 NOTE — Telephone Encounter (Signed)
I have faxed request to Dr. Manuella Ghazi for current medication list and any recent labs.

## 2017-12-07 NOTE — Patient Instructions (Signed)
I am going to request the most recent medication list from your primary care.   You can get chewable calcium with Vitamin D over the counter. This would be a better option for you than having to swallow the larger pills.  Take the pills with applesauce in the morning and night. Any larger pills that are not extended release can be divided into smaller pieces.   Please call in 2 weeks with an update of how you are doing!  It was a pleasure to see you today. I strive to create trusting relationships with patients to provide genuine, compassionate, and quality care. I value your feedback. If you receive a survey regarding your visit,  I greatly appreciate you taking time to fill this out.   Annitta Needs, PhD, ANP-BC [facility]

## 2017-12-08 NOTE — Telephone Encounter (Signed)
Patient seen by AB yesterday. Records given to AB.

## 2017-12-08 NOTE — Telephone Encounter (Signed)
Received the current med list and labs from Dr. Manuella Ghazi and placed on Leslie's desk.

## 2017-12-08 NOTE — Telephone Encounter (Signed)
Noted  

## 2017-12-08 NOTE — Progress Notes (Signed)
CC'D TO PCP °

## 2017-12-21 ENCOUNTER — Telehealth: Payer: Self-pay | Admitting: Internal Medicine

## 2017-12-21 NOTE — Telephone Encounter (Signed)
Great!

## 2017-12-21 NOTE — Telephone Encounter (Signed)
Pt's son, Louie Casa, was calling to give RMR nurse a report on how patient was doing. He asked to go to VM.

## 2017-12-21 NOTE — Telephone Encounter (Signed)
Received VM from pts son. Pt isn't having any swallowing issues. Pt is taking a chewable calcium tablet that is working well for him, his pills are being crushed by the the nursing staff and taken with applesauce.

## 2017-12-25 ENCOUNTER — Observation Stay (HOSPITAL_COMMUNITY)
Admission: EM | Admit: 2017-12-25 | Discharge: 2017-12-27 | Disposition: A | Discharge status: Home / Self Care | Payer: Medicare Other | Attending: Internal Medicine | Admitting: Internal Medicine

## 2017-12-25 ENCOUNTER — Emergency Department (HOSPITAL_COMMUNITY): Payer: Medicare Other

## 2017-12-25 ENCOUNTER — Encounter (HOSPITAL_COMMUNITY): Payer: Self-pay | Admitting: Emergency Medicine

## 2017-12-25 DIAGNOSIS — I083 Combined rheumatic disorders of mitral, aortic and tricuspid valves: Secondary | ICD-10-CM | POA: Diagnosis not present

## 2017-12-25 DIAGNOSIS — E871 Hypo-osmolality and hyponatremia: Secondary | ICD-10-CM | POA: Insufficient documentation

## 2017-12-25 DIAGNOSIS — Z515 Encounter for palliative care: Secondary | ICD-10-CM | POA: Diagnosis not present

## 2017-12-25 DIAGNOSIS — Z66 Do not resuscitate: Secondary | ICD-10-CM | POA: Insufficient documentation

## 2017-12-25 DIAGNOSIS — I959 Hypotension, unspecified: Secondary | ICD-10-CM | POA: Diagnosis not present

## 2017-12-25 DIAGNOSIS — E039 Hypothyroidism, unspecified: Secondary | ICD-10-CM | POA: Diagnosis not present

## 2017-12-25 DIAGNOSIS — J439 Emphysema, unspecified: Secondary | ICD-10-CM | POA: Insufficient documentation

## 2017-12-25 DIAGNOSIS — N400 Enlarged prostate without lower urinary tract symptoms: Secondary | ICD-10-CM | POA: Diagnosis not present

## 2017-12-25 DIAGNOSIS — I272 Pulmonary hypertension, unspecified: Secondary | ICD-10-CM | POA: Insufficient documentation

## 2017-12-25 DIAGNOSIS — J8410 Pulmonary fibrosis, unspecified: Secondary | ICD-10-CM | POA: Insufficient documentation

## 2017-12-25 DIAGNOSIS — J9611 Chronic respiratory failure with hypoxia: Secondary | ICD-10-CM | POA: Diagnosis not present

## 2017-12-25 DIAGNOSIS — I5032 Chronic diastolic (congestive) heart failure: Secondary | ICD-10-CM | POA: Diagnosis not present

## 2017-12-25 DIAGNOSIS — I2510 Atherosclerotic heart disease of native coronary artery without angina pectoris: Secondary | ICD-10-CM | POA: Diagnosis not present

## 2017-12-25 DIAGNOSIS — I714 Abdominal aortic aneurysm, without rupture, unspecified: Secondary | ICD-10-CM | POA: Diagnosis present

## 2017-12-25 DIAGNOSIS — Z9981 Dependence on supplemental oxygen: Secondary | ICD-10-CM | POA: Insufficient documentation

## 2017-12-25 DIAGNOSIS — E785 Hyperlipidemia, unspecified: Secondary | ICD-10-CM | POA: Diagnosis not present

## 2017-12-25 DIAGNOSIS — R0600 Dyspnea, unspecified: Secondary | ICD-10-CM

## 2017-12-25 DIAGNOSIS — Z8249 Family history of ischemic heart disease and other diseases of the circulatory system: Secondary | ICD-10-CM | POA: Diagnosis not present

## 2017-12-25 DIAGNOSIS — Z7902 Long term (current) use of antithrombotics/antiplatelets: Secondary | ICD-10-CM | POA: Diagnosis not present

## 2017-12-25 DIAGNOSIS — J80 Acute respiratory distress syndrome: Secondary | ICD-10-CM | POA: Diagnosis not present

## 2017-12-25 DIAGNOSIS — Z87891 Personal history of nicotine dependence: Secondary | ICD-10-CM | POA: Insufficient documentation

## 2017-12-25 DIAGNOSIS — R55 Syncope and collapse: Secondary | ICD-10-CM | POA: Diagnosis not present

## 2017-12-25 DIAGNOSIS — K222 Esophageal obstruction: Secondary | ICD-10-CM | POA: Insufficient documentation

## 2017-12-25 DIAGNOSIS — N183 Chronic kidney disease, stage 3 unspecified: Secondary | ICD-10-CM | POA: Diagnosis present

## 2017-12-25 DIAGNOSIS — J849 Interstitial pulmonary disease, unspecified: Secondary | ICD-10-CM | POA: Diagnosis present

## 2017-12-25 DIAGNOSIS — I951 Orthostatic hypotension: Secondary | ICD-10-CM | POA: Diagnosis not present

## 2017-12-25 DIAGNOSIS — R42 Dizziness and giddiness: Secondary | ICD-10-CM | POA: Diagnosis not present

## 2017-12-25 DIAGNOSIS — Z7982 Long term (current) use of aspirin: Secondary | ICD-10-CM | POA: Insufficient documentation

## 2017-12-25 DIAGNOSIS — R0902 Hypoxemia: Secondary | ICD-10-CM | POA: Diagnosis not present

## 2017-12-25 DIAGNOSIS — Z79899 Other long term (current) drug therapy: Secondary | ICD-10-CM | POA: Diagnosis not present

## 2017-12-25 HISTORY — DX: Dyspnea, unspecified: R06.00

## 2017-12-25 HISTORY — DX: Dependence on supplemental oxygen: Z99.81

## 2017-12-25 LAB — CBC
HCT: 32.3 % — ABNORMAL LOW (ref 39.0–52.0)
Hemoglobin: 10.1 g/dL — ABNORMAL LOW (ref 13.0–17.0)
MCH: 29 pg (ref 26.0–34.0)
MCHC: 31.3 g/dL (ref 30.0–36.0)
MCV: 92.8 fL (ref 78.0–100.0)
Platelets: 151 10*3/uL (ref 150–400)
RBC: 3.48 MIL/uL — ABNORMAL LOW (ref 4.22–5.81)
RDW: 13.9 % (ref 11.5–15.5)
WBC: 8.3 10*3/uL (ref 4.0–10.5)

## 2017-12-25 LAB — BASIC METABOLIC PANEL
Anion gap: 9 (ref 5–15)
BUN: 12 mg/dL (ref 8–23)
CALCIUM: 9 mg/dL (ref 8.9–10.3)
CO2: 25 mmol/L (ref 22–32)
CREATININE: 1.66 mg/dL — AB (ref 0.61–1.24)
Chloride: 99 mmol/L (ref 98–111)
GFR calc Af Amer: 39 mL/min — ABNORMAL LOW (ref >60)
GFR, EST NON AFRICAN AMERICAN: 34 mL/min — AB (ref >60)
Glucose, Bld: 103 mg/dL — ABNORMAL HIGH (ref 70–99)
Potassium: 3.5 mmol/L (ref 3.5–5.1)
SODIUM: 133 mmol/L — AB (ref 135–145)

## 2017-12-25 LAB — URINALYSIS, ROUTINE W REFLEX MICROSCOPIC
Bilirubin Urine: NEGATIVE
Glucose, UA: NEGATIVE mg/dL
Hgb urine dipstick: NEGATIVE
KETONES UR: NEGATIVE mg/dL
LEUKOCYTES UA: NEGATIVE
NITRITE: NEGATIVE
PH: 6 (ref 5.0–8.0)
Protein, ur: NEGATIVE mg/dL
Specific Gravity, Urine: 1.016 (ref 1.005–1.030)

## 2017-12-25 LAB — CBG MONITORING, ED: Glucose-Capillary: 95 mg/dL (ref 70–99)

## 2017-12-25 LAB — I-STAT TROPONIN, ED: TROPONIN I, POC: 0.01 ng/mL (ref 0.00–0.08)

## 2017-12-25 MED ORDER — SODIUM CHLORIDE 0.9 % IV BOLUS
500.0000 mL | Freq: Once | INTRAVENOUS | Status: AC
  Administered 2017-12-25: 500 mL via INTRAVENOUS

## 2017-12-25 MED ORDER — HEPARIN SODIUM (PORCINE) 5000 UNIT/ML IJ SOLN
5000.0000 [IU] | Freq: Three times a day (TID) | INTRAMUSCULAR | Status: DC
  Administered 2017-12-25 – 2017-12-27 (×5): 5000 [IU] via SUBCUTANEOUS
  Filled 2017-12-25 (×5): qty 1

## 2017-12-25 MED ORDER — VITAMIN B-12 1000 MCG PO TABS
500.0000 ug | ORAL_TABLET | Freq: Every day | ORAL | Status: DC
  Administered 2017-12-26 – 2017-12-27 (×2): 500 ug via ORAL
  Filled 2017-12-25 (×3): qty 1

## 2017-12-25 MED ORDER — ALFUZOSIN HCL ER 10 MG PO TB24
10.0000 mg | ORAL_TABLET | Freq: Every day | ORAL | Status: DC
  Administered 2017-12-26 – 2017-12-27 (×2): 10 mg via ORAL
  Filled 2017-12-25 (×4): qty 1

## 2017-12-25 MED ORDER — SODIUM CHLORIDE 0.9 % IV SOLN
INTRAVENOUS | Status: AC
  Administered 2017-12-25: 23:00:00 via INTRAVENOUS

## 2017-12-25 MED ORDER — FINASTERIDE 5 MG PO TABS
5.0000 mg | ORAL_TABLET | Freq: Every day | ORAL | Status: DC
  Administered 2017-12-25 – 2017-12-26 (×2): 5 mg via ORAL
  Filled 2017-12-25 (×3): qty 1

## 2017-12-25 MED ORDER — SENNOSIDES-DOCUSATE SODIUM 8.6-50 MG PO TABS
1.0000 | ORAL_TABLET | Freq: Every evening | ORAL | Status: DC | PRN
  Administered 2017-12-27: 1 via ORAL
  Filled 2017-12-25: qty 1

## 2017-12-25 MED ORDER — MYCOPHENOLATE MOFETIL 500 MG PO TABS: 500.0000 mg | ORAL_TABLET | ORAL | Status: DC

## 2017-12-25 MED ORDER — ACETAMINOPHEN 325 MG PO TABS
650.0000 mg | ORAL_TABLET | Freq: Once | ORAL | Status: AC
  Administered 2017-12-25: 650 mg via ORAL
  Filled 2017-12-25: qty 2

## 2017-12-25 MED ORDER — CLOPIDOGREL BISULFATE 75 MG PO TABS
75.0000 mg | ORAL_TABLET | Freq: Every day | ORAL | Status: DC
  Administered 2017-12-26 – 2017-12-27 (×2): 75 mg via ORAL
  Filled 2017-12-25 (×2): qty 1

## 2017-12-25 MED ORDER — ATORVASTATIN CALCIUM 80 MG PO TABS
80.0000 mg | ORAL_TABLET | Freq: Every day | ORAL | Status: DC
  Administered 2017-12-26: 80 mg via ORAL
  Filled 2017-12-25: qty 1

## 2017-12-25 MED ORDER — MYCOPHENOLATE MOFETIL 250 MG PO CAPS
500.0000 mg | ORAL_CAPSULE | Freq: Every day | ORAL | Status: DC
  Administered 2017-12-25 – 2017-12-26 (×2): 500 mg via ORAL
  Filled 2017-12-25 (×3): qty 2

## 2017-12-25 MED ORDER — ASPIRIN EC 81 MG PO TBEC
81.0000 mg | DELAYED_RELEASE_TABLET | Freq: Every day | ORAL | Status: DC
Start: 2017-12-25 — End: 2017-12-27
  Administered 2017-12-25 – 2017-12-26 (×2): 81 mg via ORAL
  Filled 2017-12-25 (×2): qty 1

## 2017-12-25 MED ORDER — LEVOTHYROXINE SODIUM 125 MCG PO TABS
62.5000 ug | ORAL_TABLET | Freq: Every day | ORAL | Status: DC
  Administered 2017-12-26 – 2017-12-27 (×2): 62.5 ug via ORAL
  Filled 2017-12-25 (×3): qty 0.5

## 2017-12-25 MED ORDER — ONDANSETRON HCL 4 MG PO TABS: 4.0000 mg | ORAL_TABLET | Freq: Four times a day (QID) | ORAL | Status: DC | PRN

## 2017-12-25 MED ORDER — ADULT MULTIVITAMIN W/MINERALS CH
1.0000 | ORAL_TABLET | Freq: Every day | ORAL | Status: DC
  Administered 2017-12-26 – 2017-12-27 (×2): 1 via ORAL
  Filled 2017-12-25 (×2): qty 1

## 2017-12-25 MED ORDER — SODIUM CHLORIDE 0.9% FLUSH
3.0000 mL | Freq: Two times a day (BID) | INTRAVENOUS | Status: DC
  Administered 2017-12-25 – 2017-12-27 (×3): 3 mL via INTRAVENOUS

## 2017-12-25 MED ORDER — IPRATROPIUM-ALBUTEROL 0.5-2.5 (3) MG/3ML IN SOLN: 3.0000 mL | RESPIRATORY_TRACT | Status: DC | PRN

## 2017-12-25 MED ORDER — MYCOPHENOLATE MOFETIL 250 MG PO CAPS
1000.0000 mg | ORAL_CAPSULE | Freq: Every day | ORAL | Status: DC
  Administered 2017-12-26 – 2017-12-27 (×2): 1000 mg via ORAL
  Filled 2017-12-25 (×3): qty 4

## 2017-12-25 MED ORDER — CALCIUM CARBONATE 1500 (600 CA) MG PO TABS
600.0000 mg | ORAL_TABLET | Freq: Two times a day (BID) | ORAL | Status: DC
  Filled 2017-12-25 (×2): qty 1

## 2017-12-25 MED ORDER — ACETAMINOPHEN 650 MG RE SUPP: 650.0000 mg | Freq: Four times a day (QID) | RECTAL | Status: DC | PRN

## 2017-12-25 MED ORDER — ONDANSETRON HCL 4 MG/2ML IJ SOLN: 4.0000 mg | Freq: Four times a day (QID) | INTRAMUSCULAR | Status: DC | PRN

## 2017-12-25 MED ORDER — PREDNISONE 10 MG PO TABS
10.0000 mg | ORAL_TABLET | Freq: Every day | ORAL | Status: DC
  Administered 2017-12-26 – 2017-12-27 (×2): 10 mg via ORAL
  Filled 2017-12-25 (×2): qty 1

## 2017-12-25 MED ORDER — ACETAMINOPHEN 325 MG PO TABS: 650.0000 mg | ORAL_TABLET | Freq: Four times a day (QID) | ORAL | Status: DC | PRN

## 2017-12-25 MED ORDER — OMEGA-3-ACID ETHYL ESTERS 1 G PO CAPS
2.0000 g | ORAL_CAPSULE | Freq: Every day | ORAL | Status: DC
  Administered 2017-12-26 – 2017-12-27 (×2): 2 g via ORAL
  Filled 2017-12-25 (×2): qty 2

## 2017-12-25 MED ORDER — HYDROCODONE-ACETAMINOPHEN 5-325 MG PO TABS
1.0000 | ORAL_TABLET | ORAL | Status: DC | PRN
  Administered 2017-12-25: 1 via ORAL
  Filled 2017-12-25: qty 1

## 2017-12-25 NOTE — ED Triage Notes (Signed)
Per [facility], pt with family driving home when he c/o weakness and had syncopal episode for unknown specified time. On EMS arrival pt was alert and oriented with no complaints. Pt initial BP of 110/70 and then 90/56. EMS gave 250 of NS. Pt always on 4L .

## 2017-12-25 NOTE — ED Notes (Signed)
Pt is resting and appears comfortable with family at bedside.  Reason for delay explained to pt and family

## 2017-12-25 NOTE — ED Provider Notes (Signed)
[facility] EMERGENCY DEPARTMENT Provider Note   CSN: [id number] Arrival date & time: 12/25/17  1324     History   Chief Complaint Chief Complaint  Patient presents with  . Loss of Consciousness    HPI Rodney Bowman is a 84 y.o. male.  Patient with known small distal AAA (3.2 cm in 2013), carotid artery disease, COPD/interstitial lung disease on oxygen 4 L nasal cannula, aortic stenosis s/p TAVR at [place] -- presents after having a syncopal episode today.  Patient had just left the [place] where he was having his hearing aids worked on.  He was sitting at a gas station with his son Rodney Bowman when he heard him call for help.  Patient reports suddenly feeling nauseous. Patient was found to be "glazed over".  Patient's son went for help and when he returned to the car the patient was slumped to his side.  He was lifted out of the car and assisted by a nearby ambulance.  Question of patient's oxygen tubing being wrapped around his leg causing disruption with oxygen supply. While he was being tended to, he slowly woke up and return to baseline.  Patient did not have any associated headaches, chest pain, shortness of breath.  No abdominal pain.  Patient is currently back to his baseline without any complaints.  BP on scene was reportedly 110/70 and 90/56.  EMS gave a 250 cc bolus of normal saline.  No recent fevers, chills, vomiting, diarrhea.  No urinary symptoms reported. The onset of this condition was acute. The course is resolved. Aggravating factors: none. Alleviating factors: none.       Past Medical History:  Diagnosis Date  . AAA (abdominal aortic aneurysm) ([place])    Doppler (Dr Trena Platt office 07/2010...stable 3.2)  . Carotid artery disease (HCC)    Doppler, Dr. Manuella Ghazi office, June, 2012, report scanned to this EMR, less than 50% bilateral  . Connective tissue disease ([place])    followed at [facility] interstitial lung clinic  . COPD (chronic obstructive pulmonary disease) ([place])   .  Ejection fraction    Normal ejection fraction, echo, October 14, 2010,  Dr Manuella Ghazi office,, reports scan the disc E. MR  . Hyperlipidemia   . Hypothyroidism   . Interstitial lung disease ([place])    followed at [facility]  . Severe aortic stenosis 07/02/2010   Qualifier: Diagnosis of  By: Ron Parker, MD, Caffie Damme     Patient Active Problem List   Diagnosis Date Noted  . Pill dysphagia 12/07/2017  . Chronic diarrhea 06/28/2015  . Chronic obstructive pulmonary emphysema ([place]) 06/04/2011  . Hypoxemia 06/04/2011  . ILD (interstitial lung disease) ([place]) 06/04/2011  . AAA (abdominal aortic aneurysm) ([place])   . Ejection fraction   . Carotid artery disease ([place])   . AAA (abdominal aortic aneurysm) ([place])   . HYPOTHYROIDISM 07/02/2010  . DYSLIPIDEMIA 07/02/2010  . MITRAL REGURGITATION 07/02/2010  . CAD 07/02/2010  . Severe aortic stenosis 07/02/2010  . SINUS BRADYCARDIA 07/02/2010  . CAROTID ARTERY DISEASE 07/02/2010    Past Surgical History:  Procedure Laterality Date  . COLONOSCOPY  1999   Dr. Lindalou Hose, reportedly had anal stenosis   . COLONOSCOPY  2010   ?normal per PCP records  . ESOPHAGOGASTRODUODENOSCOPY  2003   [facility]: stricture in distal esophagus, s/p dilation with Savary dilation. 2 cm hiatal hernia  . ESOPHAGOGASTRODUODENOSCOPY  06/2016   [facility]: on aspirin and plavix. AVM along curvature of stomach s/p cautery and hemoclip placement  .  HERNIA REPAIR Bilateral    bilateral inguinal hernia repair  . LEFT AND RIGHT HEART CATHETERIZATION WITH CORONARY ANGIOGRAM N/A 12/15/2013   Procedure: LEFT AND RIGHT HEART CATHETERIZATION WITH CORONARY ANGIOGRAM;  Surgeon: Blane Ohara, MD;  Location: [facility] CATH LAB;  Service: Cardiovascular;  Laterality: N/A;  . PARTIAL TURP  03/01/2014   DR. [place] Medications    Prior to Admission medications   Medication Sig Start Date End Date Taking? Authorizing Provider  acetaminophen (TYLENOL) 325 MG tablet Take 650 mg by mouth every 6  (six) hours as needed.   Yes [provider]  ipratropium-albuterol (DUONEB) 0.5-2.5 (3) MG/3ML SOLN Inhale 3 mLs into the lungs every 4 (four) hours as needed. 08/13/16  Yes [provider]  alfuzosin (UROXATRAL) 10 MG 24 hr tablet Take 10 mg by mouth daily with breakfast.    [provider]  aspirin EC 81 MG tablet Take 1 tablet (81 mg total) by mouth daily. 09/04/16   Herminio Commons, MD  atorvastatin (LIPITOR) 80 MG tablet Take 80 mg by mouth daily at 6 PM.    [provider]  calcium carbonate (OSCAL) 1500 (600 Ca) MG TABS tablet Take 1,500 mg by mouth 3 (three) times daily with meals.    [provider]  clopidogrel (PLAVIX) 75 MG tablet Take 1 tablet by mouth daily. 11/09/17   [provider]  cyanocobalamin 500 MCG tablet Take 500 mcg by mouth daily.    [provider]  finasteride (PROSCAR) 5 MG tablet Take 5 mg by mouth daily.  08/17/17   [provider]  fluticasone (FLONASE) 50 MCG/ACT nasal spray Place 1 spray into both nostrils 2 (two) times daily.    [provider]  furosemide (LASIX) 20 MG tablet Take 1 tablet (20 mg total) by mouth daily. (09/04/16 HOLDING TILL NEXT OV IN 2 MONTHS) 09/04/16   Herminio Commons, MD  levothyroxine (SYNTHROID, LEVOTHROID) 125 MCG tablet Take 62.5 mcg by mouth daily before breakfast.     [provider]  Multiple Vitamin (MULTIVITAMIN) capsule Take 1 capsule by mouth daily.    [provider]  mycophenolate (CELLCEPT) 500 MG tablet Take 1,000 mg by mouth 2 (two) times daily. Takes 2 tablets in the morning and 1 tablet in the evening    [provider]  Omega-3 Fatty Acids (FISH OIL PO) Take 2 tablets by mouth daily.     [provider]  OXYGEN-HELIUM IN 3-5 L.     [provider]  pantoprazole (PROTONIX) 40 MG tablet Take 40 mg by mouth daily.    [provider]  predniSONE (DELTASONE) 10 MG tablet Take 10 mg by mouth  daily with breakfast.    [provider]  risedronate (ACTONEL) 35 MG tablet Take 35 mg by mouth every 7 (seven) days. with water on empty stomach, nothing by mouth or lie down for next 30 minutes.    [provider]  sulfamethoxazole-trimethoprim (BACTRIM DS,SEPTRA DS) 800-160 MG tablet Take 1 tablet by mouth 3 (three) times a week.    [provider]    Family History Family History  Problem Relation Age of Onset  . Heart attack Father   . Pancreatic cancer Mother   . Cancer Brother   . Colon cancer Neg Hx     Social History Social History   Tobacco Use  . Smoking status: Former Smoker    Packs/day: 1.00  Years: 25.00    Pack years: 25.00    Types: Cigarettes    Start date: 02/18/1940    Last attempt to quit: 12/09/1964    Years since quitting: 53.0  . Smokeless tobacco: Never Used  Substance Use Topics  . Alcohol use: No    Alcohol/week: 0.0 standard drinks  . Drug use: No     Allergies   Patient has no known allergies.   Review of Systems Review of Systems  Constitutional: Negative for fever.  HENT: Negative for rhinorrhea and sore throat.   Eyes: Negative for redness.  Respiratory: Negative for cough.   Cardiovascular: Negative for chest pain.  Gastrointestinal: Positive for nausea. Negative for abdominal pain, diarrhea and vomiting.  Genitourinary: Negative for dysuria.  Musculoskeletal: Negative for myalgias.  Skin: Negative for rash.  Neurological: Positive for syncope. Negative for headaches.     Physical Exam Updated Vital Signs BP 122/60   Pulse 60   Temp 98.2 F (36.8 C) (Oral)   Resp 15   SpO2 99%   Physical Exam  Constitutional: He appears well-developed and well-nourished.  HENT:  Head: Normocephalic and atraumatic.  Mouth/Throat: Oropharynx is clear and moist.  Eyes: Conjunctivae are normal. Right eye exhibits no discharge. Left eye exhibits no discharge.  Neck: Normal range of motion. Neck supple.    Cardiovascular: Normal rate and regular rhythm.  No murmur heard. Pulmonary/Chest: Effort normal and breath sounds normal. No respiratory distress. He has no wheezes. He has no rales.  Abdominal: Soft. He exhibits no pulsatile midline mass. There is no tenderness. There is no rebound and no guarding.  Neurological: He is alert.  Skin: Skin is warm and dry.  Psychiatric: He has a normal mood and affect.  Nursing note and vitals reviewed.    ED Treatments / Results  Labs (all labs ordered are listed, but only abnormal results are displayed) Labs Reviewed  BASIC METABOLIC PANEL - Abnormal; Notable for the following components:      Result Value   Sodium 133 (*)    Glucose, Bld 103 (*)    Creatinine, Ser 1.66 (*)    GFR calc non Af Amer 34 (*)    GFR calc Af Amer 39 (*)    All other components within normal limits  CBC - Abnormal; Notable for the following components:   RBC 3.48 (*)    Hemoglobin 10.1 (*)    HCT 32.3 (*)    All other components within normal limits  URINALYSIS, ROUTINE W REFLEX MICROSCOPIC  CBG MONITORING, ED  I-STAT TROPONIN, ED   ED ECG REPORT   Date: 12/25/2017  Rate: 60  Rhythm: normal sinus rhythm  QRS Axis: normal  Intervals: normal  ST/T Wave abnormalities: normal  Conduction Disutrbances:none  Narrative Interpretation:   Old EKG Reviewed: unchanged  I have personally reviewed the EKG tracing and agree with the computerized printout as noted.   Radiology Dg Chest 2 View  Result Date: 12/25/2017 CLINICAL DATA:  Weakness and syncopal episode EXAM: CHEST - 2 VIEW COMPARISON:  07/25/2016 FINDINGS: Chronic lung fibrosis is progressive over time. Mild cardiomegaly with previous aortic valve replacement. Chronic aortic atherosclerosis. No effusion. Old thoracolumbar compression fracture. IMPRESSION: Progressive pulmonary fibrotic changes. It would be possible for some pulmonary inflammation to be hidden within the chronic changes. No active process  otherwise. Electronically Signed   By: Nelson Chimes M.D.   On: 12/25/2017 15:48    Procedures Procedures (including critical care time)  Medications Ordered in ED  Medications  sodium chloride 0.9 % bolus 500 mL (0 mLs Intravenous Stopped 12/25/17 1822)  acetaminophen (TYLENOL) tablet 650 mg (650 mg Oral Given 12/25/17 1730)  sodium chloride 0.9 % bolus 500 mL (0 mLs Intravenous Stopped 12/25/17 2039)     Initial Impression / Assessment and Plan / ED Course  I have reviewed the triage vital signs and the nursing notes.  Pertinent labs & imaging results that were available during my care of the patient were reviewed by me and considered in my medical decision making (see chart for details).     Patient seen and examined. Work-up initiated. Medications ordered. EKG reviewed.   Vital signs reviewed and are as follows: BP 122/60   Pulse 60   Temp 98.2 F (36.8 C) (Oral)   Resp 15   SpO2 99%   Syncope: ECHO in 3/17 with EF 65-70% with moderate aortic stenosis, grade 1 diastolic dysfunction.  Subsequent TAVR at [facility].  Orthostatic here.  Known AAA but no abd pain or anemia to suggest rupture.  H/o carotid artery stenosis.  No HA or neuro findings to suggest ICH.   5:28 PM Patient and son updated. Spoke with [facility]. They do not feel patient would benefit from admission at this time. They would like me to give additional fluids and recheck orthostatics. Patient has received 750cc until this point. Will bolus another 500cc.   8:38 PM Orthostatics rechecked. Continues to have low BP readings.  Son Rodney Bowman updated. His phone number is in the chart. He is concerned that medications might be causing low BP as he has had other lightheaded spells.   Spoke with Dr. Myna Hidalgo who will see.   Final Clinical Impressions(s) / ED Diagnoses   Final diagnoses:  Syncope, unspecified syncope type  Orthostatic hypotension   Observe for improvement in symptoms.   ED Discharge Orders    None         Carlisle Cater, Hershal Coria 12/25/17 2041    Maudie Flakes, MD 12/25/17 2059

## 2017-12-25 NOTE — H&P (Signed)
History and Physical    Rodney Bowman OVZ:[id number] DOB: June 30, 1921 DOA: 12/25/2017  PCP: Monico Blitz, MD   Patient coming from: Home   Chief Complaint: Syncope   HPI: Rodney Bowman is a 84 y.o. male with medical history significant for coronary artery disease, interstitial lung disease on immunosuppression, hypothyroidism, chronic diastolic CHF, hypothyroidism, and BPH, now presenting to the emergency department after syncopal episode.  Patient reports that he was having an uneventful day, perhaps with some slight generalized weakness, when he developed acute lightheadedness and nausea while seated, called out for help, and was noted to suffer a brief loss of consciousness.  He regained awareness quickly, had one episode of nonbloody vomiting, remained generally weak, and EMS was called out.  BP was 90/56 with EMS and he was given 250 cc of IV fluids prior to arrival at the hospital.  ED Course: Upon arrival to the ED, patient is found to be afebrile, saturating low 90s on his usual 4 L/min supplemental oxygen, slightly tachypneic, and with blood pressure 102/64.  There was a 52 mmHg drop in SBP upon standing in the ED.  EKG features normal sinus rhythm and chest x-ray is negative for acute findings, but notable for progression and fibrotic changes.  Chemistry panel features a mild hyponatremia and creatinine of 1.66, slightly up from prior.  CBC is notable for a normocytic anemia with hemoglobin 10.1, up from 8.8 in May.  Troponin is normal.  Patient was given 1 L of IV fluid in the ED, has remained hemodynamically stable while at rest, and will be observed for ongoing evaluation and management of syncopal episode.  Review of Systems:  All other systems reviewed and apart from HPI, are negative.  Past Medical History:  Diagnosis Date  . AAA (abdominal aortic aneurysm) ([place])    Doppler (Dr Trena Platt office 07/2010...stable 3.2)  . Carotid artery disease (HCC)    Doppler, Dr. Manuella Ghazi office, June,  2012, report scanned to this EMR, less than 50% bilateral  . Connective tissue disease ([place])    followed at [facility] interstitial lung [facility]  . COPD (chronic obstructive pulmonary disease) ([place])   . Ejection fraction    Normal ejection fraction, echo, October 14, 2010,  Dr Manuella Ghazi office,, reports scan the disc E. MR  . Hyperlipidemia   . Hypothyroidism   . Interstitial lung disease ([place])    followed at [facility]  . Severe aortic stenosis 07/02/2010   Qualifier: Diagnosis of  By: Ron Parker, MD, [facility], [place]     Past Surgical History:  Procedure Laterality Date  . COLONOSCOPY  1999   Dr. Lindalou Hose, reportedly had anal stenosis   . COLONOSCOPY  2010   ?normal per PCP records  . ESOPHAGOGASTRODUODENOSCOPY  2003   [facility]: stricture in distal esophagus, s/p dilation with Savary dilation. 2 cm hiatal hernia  . ESOPHAGOGASTRODUODENOSCOPY  06/2016   [facility]: on aspirin and plavix. AVM along curvature of stomach s/p cautery and hemoclip placement  . HERNIA REPAIR Bilateral    bilateral inguinal hernia repair  . LEFT AND RIGHT HEART CATHETERIZATION WITH CORONARY ANGIOGRAM N/A 12/15/2013   Procedure: LEFT AND RIGHT HEART CATHETERIZATION WITH CORONARY ANGIOGRAM;  Surgeon: Blane Ohara, MD;  Location: [facility] CATH LAB;  Service: Cardiovascular;  Laterality: N/A;  . PARTIAL TURP  03/01/2014   DR. Exie Parody     reports that he quit smoking about 53 years ago. His smoking use included cigarettes. He started smoking about 77 years ago. He has a 25.00  pack-year smoking history. He has never used smokeless tobacco. He reports that he does not drink alcohol or use drugs.  No Known Allergies  Family History  Problem Relation Age of Onset  . Heart attack Father   . Pancreatic cancer Mother   . Cancer Brother   . Colon cancer Neg Hx      Prior to Admission medications   Medication Sig Start Date End Date Taking? Authorizing Provider  acetaminophen (TYLENOL) 325 MG tablet Take 650 mg by mouth every 6 (six) hours as  needed.   Yes [provider]  alfuzosin (UROXATRAL) 10 MG 24 hr tablet Take 10 mg by mouth daily with breakfast.   Yes [provider]  aspirin EC 81 MG tablet Take 1 tablet (81 mg total) by mouth daily. Patient taking differently: Take 81 mg by mouth at bedtime.  09/04/16  Yes Herminio Commons, MD  atorvastatin (LIPITOR) 80 MG tablet Take 80 mg by mouth daily at 6 PM.   Yes [provider]  calcium carbonate (OSCAL) 1500 (600 Ca) MG TABS tablet Take 600 mg of elemental calcium by mouth 2 (two) times daily. chewable   Yes [provider]  clopidogrel (PLAVIX) 75 MG tablet Take 1 tablet by mouth daily. 11/09/17  Yes [provider]  cyanocobalamin 500 MCG tablet Take 500 mcg by mouth daily.   Yes [provider]  finasteride (PROSCAR) 5 MG tablet Take 5 mg by mouth at bedtime.  08/17/17  Yes [provider]  ipratropium-albuterol (DUONEB) 0.5-2.5 (3) MG/3ML SOLN Inhale 3 mLs into the lungs every 4 (four) hours as needed. 08/13/16  Yes [provider]  levothyroxine (SYNTHROID, LEVOTHROID) 125 MCG tablet Take 62.5 mcg by mouth daily before breakfast.    Yes [provider]  Multiple Vitamin (MULTIVITAMIN) capsule Take 1 capsule by mouth daily.   Yes [provider]  mycophenolate (CELLCEPT) 500 MG tablet Take 500-1,000 mg by mouth See admin instructions. Takes 1000 mg tablets in the morning and 500 mg tablet in the evening   Yes [provider]  Omega-3 Fatty Acids (FISH OIL PO) Take 2 tablets by mouth daily.    Yes [provider]  OXYGEN-HELIUM IN Place 2-13 L into the nose continuous. Stationary 2 liters Movement 13 lters   Yes [provider]  predniSONE (DELTASONE) 10 MG tablet Take 10 mg by mouth daily with breakfast.   Yes [provider]  risedronate (ACTONEL) 35 MG tablet Take 35 mg by mouth every 7 (seven) days. with water on empty stomach, nothing by mouth or lie  down for next 30 minutes.   Yes [provider]  sulfamethoxazole-trimethoprim (BACTRIM DS,SEPTRA DS) 800-160 MG tablet Take 1 tablet by mouth every Monday, Wednesday, and Friday.    Yes [provider]  furosemide (LASIX) 20 MG tablet Take 1 tablet (20 mg total) by mouth daily. (09/04/16 HOLDING TILL NEXT OV IN 2 MONTHS) Patient not taking: Reported on 12/25/2017 09/04/16   Herminio Commons, MD    Physical Exam: Vitals:   12/25/17 2000 12/25/17 2014 12/25/17 2015 12/25/17 2030  BP: (!) 95/56 102/64 (!) 118/91 119/62  Pulse: 68 94 87 (!) 58  Resp: 12 (!) 21 17 18   Temp:      TempSrc:      SpO2: 100% 91% 94% 98%      Constitutional: NAD, calm  Eyes: PERTLA, lids and conjunctivae normal ENMT: Mucous membranes are moist. Posterior pharynx clear of any exudate  or lesions.   Neck: normal, supple, no masses, no thyromegaly Respiratory: Breath sounds diminished bilaterally, no wheezing, no crackles. Normal respiratory effort.    Cardiovascular: S1 & S2 heard, regular rate and rhythm. No significant JVD. Abdomen: No distension, no tenderness, soft. Bowel sounds normal.  Musculoskeletal: no clubbing / cyanosis. No joint deformity upper and lower extremities.    Skin: no significant rashes, lesions, ulcers. Warm, dry, well-perfused. Neurologic: No facial asymmetry. Gross hearing deficit. Sensation intact. Strength 5/5 in all 4 limbs.  Psychiatric: Alert and oriented to person, place, and situation. Pleasant, cooperative.     Labs on Admission: I have personally reviewed following labs and imaging studies  CBC: Recent Labs  Lab 12/25/17 1354  WBC 8.3  HGB 10.1*  HCT 32.3*  MCV 92.8  PLT 242   Basic Metabolic Panel: Recent Labs  Lab 12/25/17 1354  NA 133*  K 3.5  CL 99  CO2 25  GLUCOSE 103*  BUN 12  CREATININE 1.66*  CALCIUM 9.0   GFR: CrCl cannot be calculated (Unknown ideal weight.). Liver Function Tests: No results for input(s): AST, ALT,  ALKPHOS, BILITOT, PROT, ALBUMIN in the last 168 hours. No results for input(s): LIPASE, AMYLASE in the last 168 hours. No results for input(s): AMMONIA in the last 168 hours. Coagulation Profile: No results for input(s): INR, PROTIME in the last 168 hours. Cardiac Enzymes: No results for input(s): CKTOTAL, CKMB, CKMBINDEX, TROPONINI in the last 168 hours. BNP (last 3 results) No results for input(s): PROBNP in the last 8760 hours. HbA1C: No results for input(s): HGBA1C in the last 72 hours. CBG: Recent Labs  Lab 12/25/17 1358  GLUCAP 95   Lipid Profile: No results for input(s): CHOL, HDL, LDLCALC, TRIG, CHOLHDL, LDLDIRECT in the last 72 hours. Thyroid Function Tests: No results for input(s): TSH, T4TOTAL, FREET4, T3FREE, THYROIDAB in the last 72 hours. Anemia Panel: No results for input(s): VITAMINB12, FOLATE, FERRITIN, TIBC, IRON, RETICCTPCT in the last 72 hours. Urine analysis: No results found for: COLORURINE, APPEARANCEUR, LABSPEC, PHURINE, GLUCOSEU, HGBUR, BILIRUBINUR, KETONESUR, PROTEINUR, UROBILINOGEN, NITRITE, LEUKOCYTESUR Sepsis Labs: @LABRCNTIP (procalcitonin:4,lacticidven:4) )No results found for this or any previous visit (from the past 240 hour(s)).   Radiological Exams on Admission: Dg Chest 2 View  Result Date: 12/25/2017 CLINICAL DATA:  Weakness and syncopal episode EXAM: CHEST - 2 VIEW COMPARISON:  07/25/2016 FINDINGS: Chronic lung fibrosis is progressive over time. Mild cardiomegaly with previous aortic valve replacement. Chronic aortic atherosclerosis. No effusion. Old thoracolumbar compression fracture. IMPRESSION: Progressive pulmonary fibrotic changes. It would be possible for some pulmonary inflammation to be hidden within the chronic changes. No active process otherwise. Electronically Signed   By: Nelson Chimes M.D.   On: 12/25/2017 15:48    EKG: Independently reviewed. Normal sinus rhythm.   Assessment/Plan   1. Syncope; orthostasis   - Presents after  an episode of syncope while seated in setting of nausea with one episode non-bloody emesis, no chest pain or palpitations  - BP was low on EMS arrival and he was given 250 cc in the field  - EKG is unremarkable, troponin normal  - BP has improved with IVF  - Dehydration likely contributing and pt had 52 mmHg drop in SBP upon standing, but episode occurred while seated  - Associated nausea suggests possible vasovagal etiology  - In terms of orthostasis, finasteride likely contributing, he is already wearing compression stockings, appeared hypovolemic and will be continued on gentle IVF hydration overnight  - Continue cardiac monitoring and update  echocardiogram, repeat orthostatic vitals in am   2. CAD  - No anginal complaints  - Troponin and EKG both appear normal  - Continue ASA, Plavix, and statin    3. Chronic diastolic CHF  - Appears hypovolemic on admission  - Has Lasix ordered prn but reports has not been taking  - Given 1 liter NS in ED in light of low BP and orthostasis  - Continue gentle IVF hydration, follow daily wt and I/O's   4. CKD stage III  - SCr is 1.66 on admission, similar to prior  - Renally-dose medications, avoid hypovolemia    5. Interstitial lung disease; chronic hypoxic respiratory failure  - Follows with pulm and managed with 4 lpm supplemental O2, daily prednisone 10 mg, and Cellcept  - Appears to be stable, continue current management    6. Hypothyroidism  - Continue Synthroid     DVT prophylaxis: sq heparin  Code Status: Full  Family Communication: Discussed with patient  Consults called: None Admission status: Observation     Vianne Bulls, MD [facility] Pager [phone]  If 7PM-7AM, please contact night-coverage [email or web address] Password [facility]  12/25/2017, 8:52 PM

## 2017-12-25 NOTE — ED Notes (Signed)
Pt is c/o low back pain and neck pain.  Requesting for pain med, [place] EDPA notified

## 2017-12-26 ENCOUNTER — Observation Stay (HOSPITAL_BASED_OUTPATIENT_CLINIC_OR_DEPARTMENT_OTHER): Payer: Medicare Other

## 2017-12-26 ENCOUNTER — Observation Stay (HOSPITAL_COMMUNITY): Payer: Medicare Other

## 2017-12-26 ENCOUNTER — Other Ambulatory Visit: Payer: Self-pay

## 2017-12-26 ENCOUNTER — Encounter (HOSPITAL_COMMUNITY): Payer: Self-pay | Admitting: Internal Medicine

## 2017-12-26 DIAGNOSIS — R55 Syncope and collapse: Secondary | ICD-10-CM

## 2017-12-26 DIAGNOSIS — J849 Interstitial pulmonary disease, unspecified: Secondary | ICD-10-CM | POA: Diagnosis not present

## 2017-12-26 DIAGNOSIS — I361 Nonrheumatic tricuspid (valve) insufficiency: Secondary | ICD-10-CM | POA: Diagnosis not present

## 2017-12-26 DIAGNOSIS — N183 Chronic kidney disease, stage 3 (moderate): Secondary | ICD-10-CM

## 2017-12-26 DIAGNOSIS — I951 Orthostatic hypotension: Secondary | ICD-10-CM

## 2017-12-26 DIAGNOSIS — R0600 Dyspnea, unspecified: Secondary | ICD-10-CM | POA: Diagnosis not present

## 2017-12-26 LAB — CBC
HEMATOCRIT: 32.8 % — AB (ref 39.0–52.0)
HEMOGLOBIN: 10.4 g/dL — AB (ref 13.0–17.0)
MCH: 29.2 pg (ref 26.0–34.0)
MCHC: 31.7 g/dL (ref 30.0–36.0)
MCV: 92.1 fL (ref 78.0–100.0)
Platelets: 142 10*3/uL — ABNORMAL LOW (ref 150–400)
RBC: 3.56 MIL/uL — AB (ref 4.22–5.81)
RDW: 14.1 % (ref 11.5–15.5)
WBC: 7.5 10*3/uL (ref 4.0–10.5)

## 2017-12-26 LAB — BASIC METABOLIC PANEL
ANION GAP: 6 (ref 5–15)
BUN: 12 mg/dL (ref 8–23)
CALCIUM: 8.7 mg/dL — AB (ref 8.9–10.3)
CHLORIDE: 99 mmol/L (ref 98–111)
CO2: 25 mmol/L (ref 22–32)
Creatinine, Ser: 1.38 mg/dL — ABNORMAL HIGH (ref 0.61–1.24)
GFR calc non Af Amer: 42 mL/min — ABNORMAL LOW (ref >60)
GFR, EST AFRICAN AMERICAN: 49 mL/min — AB (ref >60)
GLUCOSE: 111 mg/dL — AB (ref 70–99)
POTASSIUM: 4.5 mmol/L (ref 3.5–5.1)
Sodium: 130 mmol/L — ABNORMAL LOW (ref 135–145)

## 2017-12-26 LAB — CORTISOL-AM, BLOOD: Cortisol - AM: 7.1 ug/dL (ref 6.7–22.6)

## 2017-12-26 LAB — ECHOCARDIOGRAM COMPLETE
Height: 70 in
Weight: 2705.49 oz

## 2017-12-26 LAB — GLUCOSE, CAPILLARY: Glucose-Capillary: 75 mg/dL (ref 70–99)

## 2017-12-26 MED ORDER — CALCIUM CARBONATE-VITAMIN D 500-200 MG-UNIT PO TABS
1.0000 | ORAL_TABLET | Freq: Two times a day (BID) | ORAL | Status: DC
  Administered 2017-12-26 – 2017-12-27 (×3): 1 via ORAL
  Filled 2017-12-26 (×3): qty 1

## 2017-12-26 MED ORDER — ALBUTEROL SULFATE (2.5 MG/3ML) 0.083% IN NEBU
2.5000 mg | INHALATION_SOLUTION | Freq: Four times a day (QID) | RESPIRATORY_TRACT | Status: DC
  Administered 2017-12-26: 2.5 mg via RESPIRATORY_TRACT
  Filled 2017-12-26: qty 3

## 2017-12-26 MED ORDER — PREDNISONE 20 MG PO TABS
50.0000 mg | ORAL_TABLET | Freq: Once | ORAL | Status: AC
  Administered 2017-12-26: 50 mg via ORAL
  Filled 2017-12-26: qty 2

## 2017-12-26 MED ORDER — SODIUM CHLORIDE 0.9 % IV SOLN
INTRAVENOUS | Status: DC
  Administered 2017-12-26: 10:00:00 via INTRAVENOUS

## 2017-12-26 MED ORDER — GUAIFENESIN ER 600 MG PO TB12
600.0000 mg | ORAL_TABLET | Freq: Two times a day (BID) | ORAL | Status: AC
  Administered 2017-12-26 – 2017-12-27 (×3): 600 mg via ORAL
  Filled 2017-12-26 (×3): qty 1

## 2017-12-26 MED ORDER — ALBUTEROL SULFATE (2.5 MG/3ML) 0.083% IN NEBU: 2.5000 mg | INHALATION_SOLUTION | Freq: Four times a day (QID) | RESPIRATORY_TRACT | Status: DC | PRN

## 2017-12-26 NOTE — Progress Notes (Signed)
Patient requested that pills be crushed.

## 2017-12-26 NOTE — Progress Notes (Signed)
Physical Therapy Treatment Patient Details Name: Rodney Bowman MRN: [id number] DOB: [birth year] Today's Date: 12/26/2017    History of Present Illness Pt is a 84 y.o. M with significant PMH of CAD, interstitial lung disease on immunosuppression, hypothyroidism, chronic diastolic CHF, hypothyroidism, BPH who presents with syncopal episode.    PT Comments    Patient seen for additional session to transfer from bed to chair (previous session concluded with patient back in bed for echo). Transferring with supervision; SpO2 97% on 4L O2. Further ambulation deferred this session due to orthostatics assessed in previous session. Will follow acutely to progress mobility.     Follow Up Recommendations  Home health PT;Supervision for mobility/OOB     Equipment Recommendations  None recommended by PT    Recommendations for Other Services       Precautions / Restrictions Precautions Precautions: Fall Precaution Comments: watch BP Restrictions Weight Bearing Restrictions: No    Mobility  Bed Mobility Overal bed mobility: Independent                Transfers Overall transfer level: Needs assistance Equipment used: None Transfers: Sit to/from Stand Sit to Stand: Supervision            Ambulation/Gait Ambulation/Gait assistance: Supervision   Assistive device: None Gait Pattern/deviations: Decreased stride length;Trunk flexed;Step-through pattern     General Gait Details: pivotal steps from bed to recliner with supervision   Stairs             Wheelchair Mobility    Modified Rankin (Stroke Patients Only)       Balance Overall balance assessment: Mild deficits observed, not formally tested                                          Cognition Arousal/Alertness: Awake/alert Behavior During Therapy: WFL for tasks assessed/performed Overall Cognitive Status: Within Functional Limits for tasks assessed                                        Exercises      General Comments        Pertinent Vitals/Pain Pain Assessment: No/denies pain    Home Living Family/patient expects to be discharged to:: Private residence Living Arrangements: Spouse/significant other;Other (Comment)(24 hour care) Available Help at Discharge: Available 24 hours/day;Personal care attendant Type of Home: House Home Access: Level entry   Home Layout: One level Home Equipment: Walker - 4 wheels;Wheelchair - Press photographer;Shower seat      Prior Function Level of Independence: Needs assistance  Gait / Transfers Assistance Needed: uses Rollator for short distance amb and wheelchair for long distance amb ADL's / Homemaking Assistance Needed: aide assists with ADL's including bathing; pt states, "especially in past two weeks."     PT Goals (current goals can now be found in the care plan section) Acute Rehab PT Goals Patient Stated Goal: "do things the way I normally do." PT Goal Formulation: With patient Time For Goal Achievement: 01/09/18 Potential to Achieve Goals: Good Progress towards PT goals: Progressing toward goals    Frequency    Min 3X/week      PT Plan Current plan remains appropriate    Co-evaluation              AM-PAC PT "6 Clicks" Daily Activity  Outcome Measure  Difficulty turning over in bed (including adjusting bedclothes, sheets and blankets)?: None Difficulty moving from lying on back to sitting on the side of the bed? : None Difficulty sitting down on and standing up from a chair with arms (e.g., wheelchair, bedside commode, etc,.)?: A Little Help needed moving to and from a bed to chair (including a wheelchair)?: A Little Help needed walking in hospital room?: A Little Help needed climbing 3-5 steps with a railing? : A Lot 6 Click Score: 19    End of Session Equipment Utilized During Treatment: Gait belt;Oxygen Activity Tolerance: Patient tolerated treatment well Patient  left: in bed;with call bell/phone within reach;Other (comment)(receiving echo ) Nurse Communication: Mobility status PT Visit Diagnosis: Dizziness and giddiness (R42);Difficulty in walking, not elsewhere classified (R26.2)     Time: 7195-9747 PT Time Calculation (min) (ACUTE ONLY): 15 min  Charges:  $Therapeutic Activity: 8-22 mins                     Ellamae Sia, PT, DPT Acute Rehabilitation Services  Pager: [phone]    Willy Eddy 12/26/2017, 5:13 PM

## 2017-12-26 NOTE — Progress Notes (Signed)
  Echocardiogram 2D Echocardiogram has been performed.  Johny Chess 12/26/2017, 4:31 PM

## 2017-12-26 NOTE — Progress Notes (Signed)
Assisted patient with urinal; ambulated to chair and to bed (approx 3-4 steps). Patient becomes very SOB (02 sats in the low 90's on 4-6 liters when standing/walking).

## 2017-12-26 NOTE — Progress Notes (Signed)
TEDS hose ordered from [employer].

## 2017-12-26 NOTE — Progress Notes (Signed)
[facility] PROGRESS NOTE  Rodney Bowman:[id number] DOB: February 15, 1922 DOA: 12/25/2017 PCP: Monico Blitz, MD  Assessment/Plan: 1. Syncope; orthostasis   - remains orthostatic inspite of fluids. Home meds include daily prednisone and cellept. -obtain cortisol level -increase daily prednisone to 60mg  stress dose -continue compression stockings  -follow echo  2. CAD  - No anginal complaints, tele unremarkable - Troponin and EKG both appear normal  - Continue ASA, Plavix, and statin    3. Chronic diastolic CHF  - Appeared hypovolemic on admission.home meds include prn lasix. echo 3017 with EF 60% and grade 1 diastolic dysfunction. Face appears puffy this am. desatting with activity on 4L. Intake and output positive only 164ml. Wt 76.7 down from 77.3 on admission. -follow echo -portable chest xray -daily wts -intake and output  4. CKD stage III  - stable at baseline, urine output good -hold nephrotoxins -intake and output -recheck in am    5. Interstitial lung disease; chronic hypoxic respiratory failure  -Chart review indicates pulm at [facility]. Note reveal diffuse parenchymal lung disease with concomitant emphysema, exertional hypoxemia. - Follows with pulm and managed with 4 lpm supplemental O2, daily  prednisone 10 mg, and Cellcept  -appears more sob this am. Oxygen saturation level drops with little activity with oxygen.  Concern fluid overload -chest xray -nebulizers    6. Hypothyroidism  - Continue Synthroid    Code Status: full Family Communication: none present Disposition Plan: home   Consultants:  none  Procedures:    Antibiotics:    HPI/Subjective: Rodney Bowman is a 84 y.o. male with medical history significant for coronary artery disease, interstitial lung disease on immunosuppression, hypothyroidism, chronic diastolic CHF, hypothyroidism, and BPH admitted for syncopal episode.  Patient reported that he was having an uneventful day, perhaps  with some slight generalized weakness, when he developed acute lightheadedness and nausea while seated, called out for help, and was noted to suffer a brief loss of consciousness.  He regained awareness quickly, had one episode of nonbloody vomiting, remained generally weak, and EMS was called out.  BP was 90/56 with EMS and he was given 250 cc of IV fluids prior to arrival at the hospital  Objective: Vitals:   12/25/17 2319 12/26/17 0500  BP: 98/63   Pulse: 69   Resp: 17 16  Temp:    SpO2: 100% 99%    Intake/Output Summary (Last 24 hours) at 12/26/2017 1011 Last data filed at 12/26/2017 3154 Gross per 24 hour  Intake 1607.43 ml  Output 1501 ml  Net 106.43 ml   Filed Weights   12/25/17 2300 12/26/17 0500  Weight: 77.3 kg 76.7 kg    Exam:   General: awake alert slightly pale, face puffy, very HOH and in no acute distress  Cardiovascular: rrr no mgr no LE edema compression stockings intact  Respiratory: mild increased work of breathing with conversation. BS with coarse, fair air movement, fine crackles bilateral bases, faint end expiratory wheeze  Abdomen: soft, non-distended +BS no guarding or rebounding  Musculoskeletal: joints without swelling/erythema   Data Reviewed: Basic Metabolic Panel: Recent Labs  Lab 12/25/17 1354  NA 133*  K 3.5  CL 99  CO2 25  GLUCOSE 103*  BUN 12  CREATININE 1.66*  CALCIUM 9.0   Liver Function Tests: No results for input(s): AST, ALT, ALKPHOS, BILITOT, PROT, ALBUMIN in the last 168 hours. No results for input(s): LIPASE, AMYLASE in the last 168 hours. No results for input(s): AMMONIA in the last 168 hours.  CBC: Recent Labs  Lab 12/25/17 1354  WBC 8.3  HGB 10.1*  HCT 32.3*  MCV 92.8  PLT 151   Cardiac Enzymes: No results for input(s): CKTOTAL, CKMB, CKMBINDEX, TROPONINI in the last 168 hours. BNP (last 3 results) No results for input(s): BNP in the last 8760 hours.  ProBNP (last 3 results) No results for input(s): PROBNP  in the last 8760 hours.  CBG: Recent Labs  Lab 12/25/17 1358 12/26/17 0505  GLUCAP 95 75    No results found for this or any previous visit (from the past 240 hour(s)).   Studies: Dg Chest 2 View  Result Date: 12/25/2017 CLINICAL DATA:  Weakness and syncopal episode EXAM: CHEST - 2 VIEW COMPARISON:  07/25/2016 FINDINGS: Chronic lung fibrosis is progressive over time. Mild cardiomegaly with previous aortic valve replacement. Chronic aortic atherosclerosis. No effusion. Old thoracolumbar compression fracture. IMPRESSION: Progressive pulmonary fibrotic changes. It would be possible for some pulmonary inflammation to be hidden within the chronic changes. No active process otherwise. Electronically Signed   By: Nelson Chimes M.D.   On: 12/25/2017 15:48    Scheduled Meds: . albuterol  2.5 mg Nebulization Q6H  . alfuzosin  10 mg Oral Q breakfast  . aspirin EC  81 mg Oral QHS  . atorvastatin  80 mg Oral q1800  . calcium carbonate  600 mg of elemental calcium Oral BID WC  . clopidogrel  75 mg Oral Daily  . finasteride  5 mg Oral QHS  . guaiFENesin  600 mg Oral BID  . heparin  5,000 Units Subcutaneous Q8H  . levothyroxine  62.5 mcg Oral QAC breakfast  . multivitamin with minerals  1 tablet Oral Daily  . mycophenolate  1,000 mg Oral Daily  . mycophenolate  500 mg Oral QHS  . omega-3 acid ethyl esters  2 g Oral Daily  . predniSONE  10 mg Oral Q breakfast  . sodium chloride flush  3 mL Intravenous Q12H  . vitamin B-12  500 mcg Oral Daily   Continuous Infusions: . sodium chloride      Principal Problem:   Syncope Active Problems:   Hypothyroidism   CAD (coronary artery disease)   AAA (abdominal aortic aneurysm) (HCC)   Chronic obstructive pulmonary emphysema (HCC)   ILD (interstitial lung disease) (HCC)   Orthostasis   CKD (chronic kidney disease), stage III (HCC)   Chronic diastolic CHF (congestive heart failure) (HCC)   Chronic respiratory failure with hypoxia ([place])    Time  spent: 65 minutes    [facility] M  [facility]  If 7PM-7AM, please contact night-coverage at [email or web address], password [facility] 12/26/2017, 10:11 AM  LOS: 0 days

## 2017-12-26 NOTE — Progress Notes (Signed)
Patient called out to state that he feels that we are not using the "right statistics" in determining his stay here in the hospital. States that his "recliner at home is better and he doesn't get as short of breath getting in and out of it". Patient assured that we will make every effort to get the patient back home to his wife and that it will not be held against him that the circumstances here are different.

## 2017-12-26 NOTE — Evaluation (Addendum)
Physical Therapy Evaluation Patient Details Name: Rodney Bowman MRN: [id number] DOB: [birth year] Today's Date: 12/26/2017   History of Present Illness  Pt is a 84 y.o. M with significant PMH of CAD, interstitial lung disease on immunosuppression, hypothyroidism, chronic diastolic CHF, hypothyroidism, BPH who presents with syncopal episode.  Clinical Impression  Pt admitted with above diagnosis. Pt currently with functional limitations due to the deficits listed below (see PT Problem List). Patient with decreased functional mobility secondary to diminished endurance. Patient performing transfers with supervision. He has a 54 mmHg drop in SBP with standing, SpO2 > 90% on 5L O2, HR 80's. Pt has 24 hour care at home and suspect he will progress mobility once orthostatic hypotension is addressed. Pt will benefit from skilled PT to increase their independence and safety with mobility to allow discharge to the venue listed below.       Follow Up Recommendations Home health PT;Supervision for mobility/OOB    Equipment Recommendations  None recommended by PT    Recommendations for Other Services       Precautions / Restrictions Precautions Precautions: Fall Precaution Comments: watch BP Restrictions Weight Bearing Restrictions: No      Mobility  Bed Mobility Overal bed mobility: Independent                Transfers Overall transfer level: Needs assistance Equipment used: None Transfers: Sit to/from Stand Sit to Stand: Supervision            Ambulation/Gait                Stairs            Wheelchair Mobility    Modified Rankin (Stroke Patients Only)       Balance Overall balance assessment: Mild deficits observed, not formally tested                                           Pertinent Vitals/Pain Pain Assessment: No/denies pain    Home Living Family/patient expects to be discharged to:: Private residence Living Arrangements:  Spouse/significant other;Other (Comment)(24 hour care) Available Help at Discharge: Available 24 hours/day;Personal care attendant Type of Home: House Home Access: Level entry     Home Layout: One level Home Equipment: Walker - 4 wheels;Wheelchair - Press photographer;Shower seat      Prior Function Level of Independence: Needs assistance   Gait / Transfers Assistance Needed: uses Rollator for short distance amb and wheelchair for long distance amb  ADL's / Homemaking Assistance Needed: aide assists with ADL's including bathing; pt states, "especially in past two weeks."        Hand Dominance        Extremity/Trunk Assessment   Upper Extremity Assessment Upper Extremity Assessment: Overall WFL for tasks assessed    Lower Extremity Assessment Lower Extremity Assessment: Overall WFL for tasks assessed    Cervical / Trunk Assessment Cervical / Trunk Assessment: Kyphotic  Communication   Communication: HOH  Cognition Arousal/Alertness: Awake/alert Behavior During Therapy: WFL for tasks assessed/performed Overall Cognitive Status: Within Functional Limits for tasks assessed                                        General Comments      Exercises     Assessment/Plan  PT Assessment Patient needs continued PT services  PT Problem List Decreased strength;Decreased activity tolerance;Decreased balance;Decreased mobility       PT Treatment Interventions DME instruction;Gait training;Functional mobility training;Therapeutic activities;Therapeutic exercise;Balance training;Patient/family education    PT Goals (Current goals can be found in the Care Plan section)  Acute Rehab PT Goals Patient Stated Goal: "do things the way I normally do." PT Goal Formulation: With patient Time For Goal Achievement: 01/09/18 Potential to Achieve Goals: Good    Frequency Min 3X/week   Barriers to discharge        Co-evaluation               AM-PAC  PT "6 Clicks" Daily Activity  Outcome Measure Difficulty turning over in bed (including adjusting bedclothes, sheets and blankets)?: None Difficulty moving from lying on back to sitting on the side of the bed? : None Difficulty sitting down on and standing up from a chair with arms (e.g., wheelchair, bedside commode, etc,.)?: A Little Help needed moving to and from a bed to chair (including a wheelchair)?: A Little Help needed walking in hospital room?: A Little Help needed climbing 3-5 steps with a railing? : A Lot 6 Click Score: 19    End of Session Equipment Utilized During Treatment: Gait belt;Oxygen Activity Tolerance: Patient tolerated treatment well Patient left: in bed;with call bell/phone within reach;Other (comment)(receiving echo ) Nurse Communication: Mobility status PT Visit Diagnosis: Dizziness and giddiness (R42);Difficulty in walking, not elsewhere classified (R26.2)    Time: 1510-1540 PT Time Calculation (min) (ACUTE ONLY): 30 min   Charges:   PT Evaluation $PT Eval Moderate Complexity: 1 Mod PT Treatments $Therapeutic Activity: 8-22 mins        Ellamae Sia, PT, DPT Acute Rehabilitation Services  Pager: [phone]   Willy Eddy 12/26/2017, 5:07 PM

## 2017-12-27 DIAGNOSIS — M2550 Pain in unspecified joint: Secondary | ICD-10-CM | POA: Diagnosis not present

## 2017-12-27 DIAGNOSIS — Z66 Do not resuscitate: Secondary | ICD-10-CM

## 2017-12-27 DIAGNOSIS — N183 Chronic kidney disease, stage 3 (moderate): Secondary | ICD-10-CM | POA: Diagnosis not present

## 2017-12-27 DIAGNOSIS — E039 Hypothyroidism, unspecified: Secondary | ICD-10-CM | POA: Diagnosis not present

## 2017-12-27 DIAGNOSIS — J431 Panlobular emphysema: Secondary | ICD-10-CM

## 2017-12-27 DIAGNOSIS — Z7401 Bed confinement status: Secondary | ICD-10-CM | POA: Diagnosis not present

## 2017-12-27 DIAGNOSIS — Z515 Encounter for palliative care: Secondary | ICD-10-CM | POA: Diagnosis not present

## 2017-12-27 DIAGNOSIS — I5032 Chronic diastolic (congestive) heart failure: Secondary | ICD-10-CM | POA: Diagnosis not present

## 2017-12-27 DIAGNOSIS — I951 Orthostatic hypotension: Secondary | ICD-10-CM | POA: Diagnosis not present

## 2017-12-27 DIAGNOSIS — J849 Interstitial pulmonary disease, unspecified: Secondary | ICD-10-CM | POA: Diagnosis not present

## 2017-12-27 DIAGNOSIS — R55 Syncope and collapse: Secondary | ICD-10-CM | POA: Diagnosis not present

## 2017-12-27 LAB — BASIC METABOLIC PANEL
Anion gap: 5 (ref 5–15)
BUN: 15 mg/dL (ref 8–23)
CO2: 26 mmol/L (ref 22–32)
CREATININE: 1.5 mg/dL — AB (ref 0.61–1.24)
Calcium: 9 mg/dL (ref 8.9–10.3)
Chloride: 103 mmol/L (ref 98–111)
GFR, EST AFRICAN AMERICAN: 44 mL/min — AB (ref >60)
GFR, EST NON AFRICAN AMERICAN: 38 mL/min — AB (ref >60)
Glucose, Bld: 91 mg/dL (ref 70–99)
POTASSIUM: 4.7 mmol/L (ref 3.5–5.1)
SODIUM: 134 mmol/L — AB (ref 135–145)

## 2017-12-27 LAB — GLUCOSE, CAPILLARY: GLUCOSE-CAPILLARY: 79 mg/dL (ref 70–99)

## 2017-12-27 LAB — CBC
HEMATOCRIT: 31 % — AB (ref 39.0–52.0)
Hemoglobin: 9.8 g/dL — ABNORMAL LOW (ref 13.0–17.0)
MCH: 28.7 pg (ref 26.0–34.0)
MCHC: 31.6 g/dL (ref 30.0–36.0)
MCV: 90.6 fL (ref 78.0–100.0)
PLATELETS: 155 10*3/uL (ref 150–400)
RBC: 3.42 MIL/uL — AB (ref 4.22–5.81)
RDW: 13.6 % (ref 11.5–15.5)
WBC: 8.6 10*3/uL (ref 4.0–10.5)

## 2017-12-27 MED ORDER — PSYLLIUM 95 % PO PACK
1.0000 | PACK | Freq: Every day | ORAL | Status: DC
  Administered 2017-12-27: 1 via ORAL
  Filled 2017-12-27: qty 1

## 2017-12-27 MED ORDER — ONDANSETRON HCL 4 MG PO TABS: 4.0000 mg | ORAL_TABLET | Freq: Four times a day (QID) | ORAL | 0 refills | Status: AC | PRN

## 2017-12-27 MED ORDER — FUROSEMIDE 20 MG PO TABS: 20.0000 mg | ORAL_TABLET | Freq: Every day | ORAL | Status: AC | PRN

## 2017-12-27 MED ORDER — SENNOSIDES-DOCUSATE SODIUM 8.6-50 MG PO TABS: 1.0000 | ORAL_TABLET | Freq: Every evening | ORAL | 0 refills | Status: AC | PRN

## 2017-12-27 MED ORDER — HYDROCODONE-ACETAMINOPHEN 5-325 MG PO TABS: 1.0000 | ORAL_TABLET | ORAL | 0 refills | Status: AC | PRN

## 2017-12-27 MED ORDER — BISACODYL 10 MG RE SUPP: 10.0000 mg | Freq: Every day | RECTAL | Status: DC | PRN

## 2017-12-27 NOTE — Progress Notes (Signed)
Consultation Note Date: 12/27/2017   Patient Name: Rodney Bowman  DOB: Jan 05, 1922  MRN: [id number]  Age / Sex: 84 y.o., male  PCP: Rodney Blitz, MD Referring Physician: Geradine Girt, DO  Reason for Consultation: Establishing goals of care, Non pain symptom management and Psychosocial/spiritual support  HPI/Patient Profile: 84 y.o. male with past medical history of COPD with interstitial lung disease, severe aortic stenosis, and pulmonary hypertension who was admitted on 12/25/2017 with a syncopal episode.  The patient was found to be orthostatic.  He has received fluids and stabilized.  PMT was requested for [place].   Clinical Assessment and Goals of Care   "when my time comes I'm ready to go - I just want to be here for my girl"  I have reviewed medical records including EPIC notes, labs and imaging, received report from the care team, assessed the patient and then talked on the phone with the patient's son Rodney Bowman to discuss diagnosis prognosis, [place], EOL wishes, disposition and options.  I introduced Palliative Medicine as specialized medical care for people living with serious illness. It focuses on providing relief from the symptoms and stress of a serious illness. The goal is to improve quality of life for both the patient and the family.  We discussed a brief life review of the patient.  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  He was in the [employer].  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  His two sons Rodney Bowman and Rodney Bowman are both ministers.  Rodney Bowman lives near by and cares for his parents.  The patient lives at home with his wife who has dementia.  He mentioned repeatedly - "I just need to keep living for my girl".  The patient and his wife are very fortunate to have 24 hour care in their  home.  As far as functional and nutritional status he is barely able to walk a few steps before he becomes to dyspneic to move.  He is now incontinent (as he cant get to the bathroom).  He spends the vast majority of time in his recliner chair on 4-6L of oxygen.  He becomes short of breath speaking even on oxygen.  He has dysphagia from esophageal stenosis and has moderate difficulty swallowing pills.  We discussed their current illness and what it means in the larger context of their on-going co-morbidities.  Natural disease trajectory and expectations at EOL were discussed.  I attempted to elicit values and goals of care important to the patient.  Rodney Bowman wants to live as long as he can in order to "be here for my girl".  However, he understands that his illness is not reversible and there is nothing that the medical team can cure.  The difference between aggressive medical intervention and comfort care was considered in light of the patient's goals of care.  Advanced directives, concepts specific to code  status, artifical feeding and hydration, and rehospitalization were considered and discussed.  Rodney Bowman had previously decided to be DNR, DNI, no feeding tube.  He states "when my time comes I'm ready to go - I just want to be here for my girl"  Hospice and Palliative Care services outpatient were explained and offered.  The patient and his son request [facility] services in their home.  Questions and concerns were addressed.  The family was encouraged to call with questions or concerns.    Primary Decision Maker:  PATIENT and son Rodney Bowman    SUMMARY OF RECOMMENDATIONS     Please discharge home with Hospice Services thru [facility].  Would recommend an RX of morphine concentrate 5 mg SL q 4 hours PRN Shortness of Breath.   (Understanding that morphine will drop his blood pressure - I would recommend only using morphine when symptomatic and when lying down)  Consider  discontinuing non-essential medications (multi vitamin, fish oil, calcium, lipitor, aspirin, actonel)  Would continue prednisone, plavix, synthroid for now.    Code Status/Advance Care Planning:  DNR   Additional Recommendations (Limitations, Scope, Preferences):  Avoid Hospitalization and Minimize Medications  Psycho-social/Spiritual:   Desire for further Chaplaincy support: both sons are ministers  Prognosis: less than 6 months secondary to severe Aortic Stenosis and advanced interstitial lung disease with rapid decline in the last 2 months.    Discharge Planning: Home with Hospice      Primary Diagnoses: Present on Admission: . Chronic obstructive pulmonary emphysema ([place]) . ILD (interstitial lung disease) ([place]) . AAA (abdominal aortic aneurysm) ([place]) . Hypothyroidism . CAD (coronary artery disease) . Orthostasis . Syncope . CKD (chronic kidney disease), stage III ([place]) . Chronic diastolic CHF (congestive heart failure) ([place]) . Chronic respiratory failure with hypoxia ([place])   I have reviewed the medical record, interviewed the patient and family, and examined the patient. The following aspects are pertinent.  Past Medical History:  Diagnosis Date  . AAA (abdominal aortic aneurysm) ([place])    Doppler (Dr Trena Platt office 07/2010...stable 3.2)  . Carotid artery disease (HCC)    Doppler, Dr. Manuella Ghazi office, June, 2012, report scanned to this EMR, less than 50% bilateral  . Connective tissue disease ([place])    followed at [facility] interstitial lung clinic  . COPD (chronic obstructive pulmonary disease) ([place])   . Dyspnea   . Ejection fraction    Normal ejection fraction, echo, October 14, 2010,  Dr Manuella Ghazi office,, reports scan the disc E. MR  . Hyperlipidemia   . Hypothyroidism   . Interstitial lung disease ([place])    followed at [facility]  . Oxygen dependent    4L  . Severe aortic stenosis 07/02/2010   Qualifier: Diagnosis of  By: Ron Parker, MD, [facility], [place]    Social History    Socioeconomic History  . Marital status: Married    Spouse name: Not on file  . Number of children: Not on file  . Years of education: Not on file  . Highest education level: Not on file  Occupational History  . Not on file  Social Needs  . Financial resource strain: Not on file  . Food insecurity:    Worry: Not on file    Inability: Not on file  . Transportation needs:    Medical: Not on file    Non-medical: Not on file  Tobacco Use  . Smoking status: Former Smoker    Packs/day: 1.00    Years: 25.00  Pack years: 25.00    Types: Cigarettes    Start date: 02/18/1940    Last attempt to quit: 12/09/1964    Years since quitting: 53.0  . Smokeless tobacco: Never Used  Substance and Sexual Activity  . Alcohol use: No    Alcohol/week: 0.0 standard drinks  . Drug use: No  . Sexual activity: Not on file  Lifestyle  . Physical activity:    Days per week: Not on file    Minutes per session: Not on file  . Stress: Not on file  Relationships  . Social connections:    Talks on phone: Not on file    Gets together: Not on file    Attends religious service: Not on file    Active member of club or organization: Not on file    Attends meetings of clubs or organizations: Not on file    Relationship status: Not on file  Other Topics Concern  . Not on file  Social History Narrative  . Not on file   Family History  Problem Relation Age of Onset  . Heart attack Father   . Pancreatic cancer Mother   . Cancer Brother   . Colon cancer Neg Hx    Scheduled Meds: . alfuzosin  10 mg Oral Q breakfast  . aspirin EC  81 mg Oral QHS  . atorvastatin  80 mg Oral q1800  . calcium-vitamin D  1 tablet Oral BID  . clopidogrel  75 mg Oral Daily  . finasteride  5 mg Oral QHS  . heparin  5,000 Units Subcutaneous Q8H  . levothyroxine  62.5 mcg Oral QAC breakfast  . multivitamin with minerals  1 tablet Oral Daily  . mycophenolate  1,000 mg Oral Daily  . mycophenolate  500 mg Oral QHS  .  omega-3 acid ethyl esters  2 g Oral Daily  . predniSONE  10 mg Oral Q breakfast  . psyllium  1 packet Oral Daily  . sodium chloride flush  3 mL Intravenous Q12H  . vitamin B-12  500 mcg Oral Daily   Continuous Infusions: PRN Meds:.acetaminophen **OR** acetaminophen, albuterol, bisacodyl, HYDROcodone-acetaminophen, ipratropium-albuterol, ondansetron **OR** ondansetron (ZOFRAN) IV, senna-docusate No Known Allergies Review of Systems denies pain, reports shortness of breath with any exertion, incontinence, weakness, fatigue.  Physical Exam  Elderly frail gentleman, awake, alert, coherent CV unable to hear Resp no distress on 3L.  Becomes SOB speaking. Abdomen soft NT, ND, +BS Extremities in Jobst stockings, no lower ext edema.  Vital Signs: BP 124/78 (BP Location: Right Arm)   Pulse 68   Temp (!) 97.2 F (36.2 C) (Oral)   Resp 18   Ht 5\' 10"  (1.778 m)   Wt 72.3 kg   SpO2 99%   BMI 22.87 kg/m  Pain Scale: 0-10   Pain Score: 0-No pain   SpO2: SpO2: 99 % O2 Device:SpO2: 99 % O2 Flow Rate: .O2 Flow Rate (L/min): 3 L/min  IO: Intake/output summary:   Intake/Output Summary (Last 24 hours) at 12/27/2017 1137 Last data filed at 12/27/2017 1120 Gross per 24 hour  Intake 1211 ml  Output 2275 ml  Net -1064 ml    LBM:   Baseline Weight: Weight: 77.3 kg Most recent weight: Weight: 72.3 kg     Palliative Assessment/Data:40%     Time In: 9:30 Time Out: 11:00 Time Total: 90 min. Greater than 50%  of this time was spent counseling and coordinating care related to the above assessment and plan.  Signed by: Haynes Dage  Hilliard Borges, PA-C Palliative Medicine Pager: [phone]  Please contact Palliative Medicine Team phone at [phone] for questions and concerns.  For individual provider: See Shea Evans

## 2017-12-27 NOTE — Discharge Summary (Signed)
Physician Discharge Summary  Rodney Bowman UVO:[id number] DOB: July 06, 1921 DOA: 12/25/2017  PCP: Monico Blitz, MD  Admit date: 12/25/2017 Discharge date: 12/27/2017  Time spent: 65 minutes  Recommendations for Outpatient Follow-up:  1. Home with Home with Hospice Services through [facility] 2. Defer medication management to PCP and/or [place]   Discharge Diagnoses:  Principal Problem:   Syncope Active Problems:   Hypothyroidism   CAD (coronary artery disease)   AAA (abdominal aortic aneurysm) (HCC)   Chronic obstructive pulmonary emphysema (HCC)   ILD (interstitial lung disease) (HCC)   Orthostasis   CKD (chronic kidney disease), stage III (HCC)   Chronic diastolic CHF (congestive heart failure) (HCC)   Chronic respiratory failure with hypoxia (HCC)   DNR (do not resuscitate)   Palliative care encounter   Discharge Condition: at baseline  Diet recommendation: heart healthy  Filed Weights   12/25/17 2300 12/26/17 0500 12/27/17 0546  Weight: 77.3 kg 76.7 kg 72.3 kg    History of present illness:  84 y.o. male with past medical history of COPD with interstitial lung disease, severe aortic stenosis, and pulmonary hypertension who was admitted on 12/25/2017 with a syncopal episode.  The patient was found to be orthostatic.  He has received fluids and stabilized.     Hospital Course:  1.Syncope; orthostasis -resolved at discharge and after IV fluids. Home meds include daily prednisone and cellept. Cortisol level 7.1. No s/sx infection. No metabolic derangement. Echo with EF 65%, increased wall thickness, moderate LVH. No events on tele. Will change home lasix to prn at discharge  2.CAD -No anginal complaints, tele unremarkable -Troponin and EKG both appear normal -Continue ASA, Plavix, and statin  3.Chronic diastolic CHF -Appeared hypovolemic on admission.home meds include prn lasix. echo as noted above. Weight 72.3kg. Chest  xray yesterday Severe appearing chronic interstitial lung disease without evidence of acute process.  4.CKD stage III -stable at baseline, urine output good   5.Interstitial lung disease; chronic hypoxic respiratory failure -Chart review indicates pulm at [facility]. Note reveal diffuse parenchymal lung disease with concomitant emphysema, exertional hypoxemia. -Follows with pulm and managed with 4 lpm supplemental O2, daily  prednisone 10 mg, and Cellcept. Oxygen saturation levels drop with slightest of activity. Patient reports this is baseline. Evaluated by Palliative care for goals of care. Patient requests home discharge with [facility]  6.Hypothyroidism -Continue Synthroid   Procedures:    Consultations:  Palliative care  Discharge Exam: Vitals:   12/27/17 0955 12/27/17 1445  BP: 124/78 109/67  Pulse: 68 61  Resp: 18 16  Temp: (!) 97.2 F (36.2 C) 98 F (36.7 C)  SpO2: 99% 97%    General: sitting up in bed eating lunch and watching TV Cardiovascular: RRR no mgr compression stockings intact. No LEE Respiratory: mild increased work of breathing with conversation and eating. BS quite coarse with decreased air flow. Faint wheeze  Discharge Instructions   Discharge Instructions    Call MD for:  difficulty breathing, headache or visual disturbances   Complete by:  As directed    Call MD for:  severe uncontrolled pain   Complete by:  As directed    Call MD for:  temperature >100.4   Complete by:  As directed    Diet - low sodium heart healthy   Complete by:  As directed    Discharge instructions   Complete by:  As directed    Home with [place]   Increase activity  slowly   Complete by:  As directed      Allergies as of 12/27/2017   No Known Allergies     Medication List    STOP taking these medications   ACTONEL 35 MG tablet Generic drug:  risedronate   aspirin EC 81 MG tablet   atorvastatin  80 MG tablet Commonly known as:  LIPITOR   FISH OIL PO   multivitamin capsule     TAKE these medications   acetaminophen 325 MG tablet Commonly known as:  TYLENOL Take 650 mg by mouth every 6 (six) hours as needed.   alfuzosin 10 MG 24 hr tablet Commonly known as:  UROXATRAL Take 10 mg by mouth daily with breakfast.   calcium carbonate 1500 (600 Ca) MG Tabs tablet Commonly known as:  OSCAL Take 600 mg of elemental calcium by mouth 2 (two) times daily. chewable   clopidogrel 75 MG tablet Commonly known as:  PLAVIX Take 1 tablet by mouth daily.   finasteride 5 MG tablet Commonly known as:  PROSCAR Take 5 mg by mouth at bedtime.   furosemide 20 MG tablet Commonly known as:  LASIX Take 1 tablet (20 mg total) by mouth daily as needed for fluid or edema. (09/04/16 HOLDING TILL NEXT OV IN 2 MONTHS) What changed:    when to take this  reasons to take this   HYDROcodone-acetaminophen 5-325 MG tablet Commonly known as:  NORCO/VICODIN Take 1-2 tablets by mouth every 4 (four) hours as needed for moderate pain or severe pain.   ipratropium-albuterol 0.5-2.5 (3) MG/3ML Soln Commonly known as:  DUONEB Inhale 3 mLs into the lungs every 4 (four) hours as needed.   levothyroxine 125 MCG tablet Commonly known as:  SYNTHROID, LEVOTHROID Take 62.5 mcg by mouth daily before breakfast.   mycophenolate 500 MG tablet Commonly known as:  CELLCEPT Take 500-1,000 mg by mouth See admin instructions. Takes 1000 mg tablets in the morning and 500 mg tablet in the evening   ondansetron 4 MG tablet Commonly known as:  ZOFRAN Take 1 tablet (4 mg total) by mouth every 6 (six) hours as needed for nausea.   OXYGEN Place 2-13 L into the nose continuous. Stationary 2 liters Movement 13 lters   predniSONE 10 MG tablet Commonly known as:  DELTASONE Take 10 mg by mouth daily with breakfast.   senna-docusate 8.6-50 MG tablet Commonly known as:  Senokot-S Take 1 tablet by mouth at bedtime as  needed for mild constipation.   sulfamethoxazole-trimethoprim 800-160 MG tablet Commonly known as:  BACTRIM DS,SEPTRA DS Take 1 tablet by mouth every Monday, Wednesday, and Friday.   vitamin B-12 500 MCG tablet Commonly known as:  CYANOCOBALAMIN Take 500 mcg by mouth daily.      No Known Allergies    The results of significant diagnostics from this hospitalization (including imaging, microbiology, ancillary and laboratory) are listed below for reference.    Significant Diagnostic Studies: Dg Chest 2 View  Result Date: 12/25/2017 CLINICAL DATA:  Weakness and syncopal episode EXAM: CHEST - 2 VIEW COMPARISON:  07/25/2016 FINDINGS: Chronic lung fibrosis is progressive over time. Mild cardiomegaly with previous aortic valve replacement. Chronic aortic atherosclerosis. No effusion. Old thoracolumbar compression fracture. IMPRESSION: Progressive pulmonary fibrotic changes. It would be possible for some pulmonary inflammation to be hidden within the chronic changes. No active process otherwise. Electronically Signed   By: Nelson Chimes M.D.   On: 12/25/2017 15:48   Dg Chest Port 1 View  Result Date: 12/26/2017 CLINICAL DATA:  Dyspnea in a patient with a history of interstitial lung disease. EXAM: PORTABLE CHEST 1 VIEW COMPARISON:  Single-view of the chest 07/25/2016. PA and lateral chest 12/25/2017. FINDINGS: Extensive coarsening of the pulmonary interstitium most consistent with chronic interstitial lung disease is again seen. No evidence of superimposed acute process is identified. Heart size is mildly enlarged. Atherosclerosis is noted. No pneumothorax or pleural effusion. IMPRESSION: Severe appearing chronic interstitial lung disease without evidence of acute process. Atherosclerosis. Electronically Signed   By: Inge Rise M.D.   On: 12/26/2017 11:12    Microbiology: No results found for this or any previous visit (from the past 240 hour(s)).   Labs: Basic Metabolic Panel: Recent  Labs  Lab 12/25/17 1354 12/26/17 1319 12/27/17 0725  NA 133* 130* 134*  K 3.5 4.5 4.7  CL 99 99 103  CO2 25 25 26   GLUCOSE 103* 111* 91  BUN 12 12 15   CREATININE 1.66* 1.38* 1.50*  CALCIUM 9.0 8.7* 9.0   Liver Function Tests: No results for input(s): AST, ALT, ALKPHOS, BILITOT, PROT, ALBUMIN in the last 168 hours. No results for input(s): LIPASE, AMYLASE in the last 168 hours. No results for input(s): AMMONIA in the last 168 hours. CBC: Recent Labs  Lab 12/25/17 1354 12/26/17 1319 12/27/17 0725  WBC 8.3 7.5 8.6  HGB 10.1* 10.4* 9.8*  HCT 32.3* 32.8* 31.0*  MCV 92.8 92.1 90.6  PLT 151 142* 155   Cardiac Enzymes: No results for input(s): CKTOTAL, CKMB, CKMBINDEX, TROPONINI in the last 168 hours. BNP: BNP (last 3 results) No results for input(s): BNP in the last 8760 hours.  ProBNP (last 3 results) No results for input(s): PROBNP in the last 8760 hours.  CBG: Recent Labs  Lab 12/25/17 1358 12/26/17 0505 12/27/17 0729  GLUCAP 95 75 79       Signed:  Radene Gunning MD.  [facility] 12/27/2017, 3:21 PM

## 2017-12-27 NOTE — Progress Notes (Signed)
Patient was found sitting up in bed (did not call for assistance) with a O2 sat of 87% on [place] 3lpm. Patient states that he knows his oxygen was a little low "but I just wanted to check it". Patient encouraged to contact nurse or nurse assistant when attempting to get out of bed.

## 2017-12-27 NOTE — Progress Notes (Signed)
Pt. Attempted to use bedside commode to have BM, pt called prior to have O2 increased for movement. Pt. C/o not being able to have BM requesting "some fiber to help go". Notified Vann MD via Amion of pt request.

## 2017-12-27 NOTE — Care Management (Signed)
ED CM received return call from [facility] on call nurse Caryl Pina concerning referral for start of care. She requested the referral be faxed to [facility], referral was faxed to [phone] via [place] in addition. No further CM needs identified.

## 2017-12-27 NOTE — Care Management Note (Addendum)
Case Management Note  Patient Details  Name: Rodney Bowman MRN: [id number] Date of Birth: 27-Jul-1921  Subjective/Objective:         Pt    Presented to [facility] ED with c/o weakness and had syncopal episode. Hx of COPD on home oxygen         Action/Plan: ED CM received CM consult with recommmendations for home hospice services. Patient lives alone and is on continuous home oxygen hx of COPD. CM met with patient at bedside to discuss recommendations, patient agrees with transitional care plan for Home hospice of [facility]. Referral faxed in  via [place]. Also called the on-call nurse, patient made aware nurse will call to contact patient and son to arrange intial visit.   Patient verbalized understanding and teach back done.  Patient will be transported home via [place] Updated nurse on [facility] of transitional care plan  Expected Discharge Date:  12/27/17               Expected Discharge Plan:  [place] Referral:     Discharge planning Services  CM Consult  Post Acute Care Choice:    Choice offered to:  Patient  DME Arranged:    DME Agency:     HH Arranged:    [place]  Status of Service:  Completed, signed off  If discussed at [employer], dates discussed:    Additional CommentsLaurena Slimmer, RN 12/27/2017, 4:01 PM

## 2017-12-29 DIAGNOSIS — E039 Hypothyroidism, unspecified: Secondary | ICD-10-CM | POA: Diagnosis not present

## 2017-12-29 DIAGNOSIS — I5032 Chronic diastolic (congestive) heart failure: Secondary | ICD-10-CM | POA: Diagnosis not present

## 2017-12-29 DIAGNOSIS — J449 Chronic obstructive pulmonary disease, unspecified: Secondary | ICD-10-CM | POA: Diagnosis not present

## 2017-12-29 DIAGNOSIS — I959 Hypotension, unspecified: Secondary | ICD-10-CM | POA: Diagnosis not present

## 2017-12-29 DIAGNOSIS — R0602 Shortness of breath: Secondary | ICD-10-CM | POA: Diagnosis not present

## 2017-12-29 DIAGNOSIS — R0902 Hypoxemia: Secondary | ICD-10-CM | POA: Diagnosis not present

## 2017-12-29 DIAGNOSIS — I080 Rheumatic disorders of both mitral and aortic valves: Secondary | ICD-10-CM | POA: Diagnosis not present

## 2017-12-29 DIAGNOSIS — N183 Chronic kidney disease, stage 3 (moderate): Secondary | ICD-10-CM | POA: Diagnosis not present

## 2017-12-29 DIAGNOSIS — I714 Abdominal aortic aneurysm, without rupture: Secondary | ICD-10-CM | POA: Diagnosis not present

## 2017-12-29 DIAGNOSIS — J849 Interstitial pulmonary disease, unspecified: Secondary | ICD-10-CM | POA: Diagnosis not present

## 2017-12-29 DIAGNOSIS — K219 Gastro-esophageal reflux disease without esophagitis: Secondary | ICD-10-CM | POA: Diagnosis not present

## 2017-12-29 DIAGNOSIS — R531 Weakness: Secondary | ICD-10-CM | POA: Diagnosis not present

## 2017-12-29 DIAGNOSIS — R52 Pain, unspecified: Secondary | ICD-10-CM | POA: Diagnosis not present

## 2017-12-29 DIAGNOSIS — J439 Emphysema, unspecified: Secondary | ICD-10-CM | POA: Diagnosis not present

## 2017-12-29 DIAGNOSIS — I350 Nonrheumatic aortic (valve) stenosis: Secondary | ICD-10-CM | POA: Diagnosis not present

## 2017-12-31 DIAGNOSIS — J439 Emphysema, unspecified: Secondary | ICD-10-CM | POA: Diagnosis not present

## 2017-12-31 DIAGNOSIS — I080 Rheumatic disorders of both mitral and aortic valves: Secondary | ICD-10-CM | POA: Diagnosis not present

## 2017-12-31 DIAGNOSIS — I714 Abdominal aortic aneurysm, without rupture: Secondary | ICD-10-CM | POA: Diagnosis not present

## 2017-12-31 DIAGNOSIS — E039 Hypothyroidism, unspecified: Secondary | ICD-10-CM | POA: Diagnosis not present

## 2017-12-31 DIAGNOSIS — J449 Chronic obstructive pulmonary disease, unspecified: Secondary | ICD-10-CM | POA: Diagnosis not present

## 2017-12-31 DIAGNOSIS — J849 Interstitial pulmonary disease, unspecified: Secondary | ICD-10-CM | POA: Diagnosis not present

## 2018-01-04 DIAGNOSIS — M549 Dorsalgia, unspecified: Secondary | ICD-10-CM | POA: Diagnosis not present

## 2018-01-04 DIAGNOSIS — Z6826 Body mass index (BMI) 26.0-26.9, adult: Secondary | ICD-10-CM | POA: Diagnosis not present

## 2018-01-04 DIAGNOSIS — J449 Chronic obstructive pulmonary disease, unspecified: Secondary | ICD-10-CM | POA: Diagnosis not present

## 2018-01-04 DIAGNOSIS — K5900 Constipation, unspecified: Secondary | ICD-10-CM | POA: Diagnosis not present

## 2018-01-04 DIAGNOSIS — Z299 Encounter for prophylactic measures, unspecified: Secondary | ICD-10-CM | POA: Diagnosis not present

## 2018-01-04 DIAGNOSIS — R110 Nausea: Secondary | ICD-10-CM | POA: Diagnosis not present

## 2018-01-05 DIAGNOSIS — J439 Emphysema, unspecified: Secondary | ICD-10-CM | POA: Diagnosis not present

## 2018-01-05 DIAGNOSIS — E039 Hypothyroidism, unspecified: Secondary | ICD-10-CM | POA: Diagnosis not present

## 2018-01-05 DIAGNOSIS — J849 Interstitial pulmonary disease, unspecified: Secondary | ICD-10-CM | POA: Diagnosis not present

## 2018-01-05 DIAGNOSIS — I080 Rheumatic disorders of both mitral and aortic valves: Secondary | ICD-10-CM | POA: Diagnosis not present

## 2018-01-05 DIAGNOSIS — I714 Abdominal aortic aneurysm, without rupture: Secondary | ICD-10-CM | POA: Diagnosis not present

## 2018-01-05 DIAGNOSIS — J449 Chronic obstructive pulmonary disease, unspecified: Secondary | ICD-10-CM | POA: Diagnosis not present

## 2018-01-08 DIAGNOSIS — J849 Interstitial pulmonary disease, unspecified: Secondary | ICD-10-CM | POA: Diagnosis not present

## 2018-01-08 DIAGNOSIS — I714 Abdominal aortic aneurysm, without rupture: Secondary | ICD-10-CM | POA: Diagnosis not present

## 2018-01-08 DIAGNOSIS — I080 Rheumatic disorders of both mitral and aortic valves: Secondary | ICD-10-CM | POA: Diagnosis not present

## 2018-01-08 DIAGNOSIS — E039 Hypothyroidism, unspecified: Secondary | ICD-10-CM | POA: Diagnosis not present

## 2018-01-08 DIAGNOSIS — J449 Chronic obstructive pulmonary disease, unspecified: Secondary | ICD-10-CM | POA: Diagnosis not present

## 2018-01-08 DIAGNOSIS — J439 Emphysema, unspecified: Secondary | ICD-10-CM | POA: Diagnosis not present

## 2018-01-13 DIAGNOSIS — I080 Rheumatic disorders of both mitral and aortic valves: Secondary | ICD-10-CM | POA: Diagnosis not present

## 2018-01-13 DIAGNOSIS — J439 Emphysema, unspecified: Secondary | ICD-10-CM | POA: Diagnosis not present

## 2018-01-13 DIAGNOSIS — E039 Hypothyroidism, unspecified: Secondary | ICD-10-CM | POA: Diagnosis not present

## 2018-01-13 DIAGNOSIS — J449 Chronic obstructive pulmonary disease, unspecified: Secondary | ICD-10-CM | POA: Diagnosis not present

## 2018-01-13 DIAGNOSIS — J849 Interstitial pulmonary disease, unspecified: Secondary | ICD-10-CM | POA: Diagnosis not present

## 2018-01-13 DIAGNOSIS — I714 Abdominal aortic aneurysm, without rupture: Secondary | ICD-10-CM | POA: Diagnosis not present

## 2018-01-19 DIAGNOSIS — I714 Abdominal aortic aneurysm, without rupture: Secondary | ICD-10-CM | POA: Diagnosis not present

## 2018-01-19 DIAGNOSIS — I080 Rheumatic disorders of both mitral and aortic valves: Secondary | ICD-10-CM | POA: Diagnosis not present

## 2018-01-19 DIAGNOSIS — J849 Interstitial pulmonary disease, unspecified: Secondary | ICD-10-CM | POA: Diagnosis not present

## 2018-01-19 DIAGNOSIS — J449 Chronic obstructive pulmonary disease, unspecified: Secondary | ICD-10-CM | POA: Diagnosis not present

## 2018-01-19 DIAGNOSIS — J439 Emphysema, unspecified: Secondary | ICD-10-CM | POA: Diagnosis not present

## 2018-01-19 DIAGNOSIS — E039 Hypothyroidism, unspecified: Secondary | ICD-10-CM | POA: Diagnosis not present

## 2018-01-20 DIAGNOSIS — J449 Chronic obstructive pulmonary disease, unspecified: Secondary | ICD-10-CM | POA: Diagnosis not present

## 2018-01-20 DIAGNOSIS — J849 Interstitial pulmonary disease, unspecified: Secondary | ICD-10-CM | POA: Diagnosis not present

## 2018-01-20 DIAGNOSIS — J439 Emphysema, unspecified: Secondary | ICD-10-CM | POA: Diagnosis not present

## 2018-01-20 DIAGNOSIS — I080 Rheumatic disorders of both mitral and aortic valves: Secondary | ICD-10-CM | POA: Diagnosis not present

## 2018-01-20 DIAGNOSIS — E039 Hypothyroidism, unspecified: Secondary | ICD-10-CM | POA: Diagnosis not present

## 2018-01-20 DIAGNOSIS — I714 Abdominal aortic aneurysm, without rupture: Secondary | ICD-10-CM | POA: Diagnosis not present

## 2018-01-21 DIAGNOSIS — J439 Emphysema, unspecified: Secondary | ICD-10-CM | POA: Diagnosis not present

## 2018-01-21 DIAGNOSIS — E039 Hypothyroidism, unspecified: Secondary | ICD-10-CM | POA: Diagnosis not present

## 2018-01-21 DIAGNOSIS — J449 Chronic obstructive pulmonary disease, unspecified: Secondary | ICD-10-CM | POA: Diagnosis not present

## 2018-01-21 DIAGNOSIS — I080 Rheumatic disorders of both mitral and aortic valves: Secondary | ICD-10-CM | POA: Diagnosis not present

## 2018-01-21 DIAGNOSIS — I714 Abdominal aortic aneurysm, without rupture: Secondary | ICD-10-CM | POA: Diagnosis not present

## 2018-01-21 DIAGNOSIS — J849 Interstitial pulmonary disease, unspecified: Secondary | ICD-10-CM | POA: Diagnosis not present

## 2018-02-11 DIAGNOSIS — Z23 Encounter for immunization: Secondary | ICD-10-CM | POA: Diagnosis not present

## 2018-02-22 DIAGNOSIS — Z6826 Body mass index (BMI) 26.0-26.9, adult: Secondary | ICD-10-CM | POA: Diagnosis not present

## 2018-02-22 DIAGNOSIS — Z299 Encounter for prophylactic measures, unspecified: Secondary | ICD-10-CM | POA: Diagnosis not present

## 2018-02-22 DIAGNOSIS — J449 Chronic obstructive pulmonary disease, unspecified: Secondary | ICD-10-CM | POA: Diagnosis not present

## 2018-04-26 ENCOUNTER — Ambulatory Visit: Payer: Medicare Other | Admitting: Cardiovascular Disease

## 2018-04-28 DEATH — deceased

## 2018-12-06 IMAGING — DX DG CHEST 2V
2 series · 2 of 2 positions shown · non-contrast
Comparison: 07/25/2016

CLINICAL DATA: Weakness and syncopal episode

EXAM:
CHEST - 2 VIEW

[chest lat]
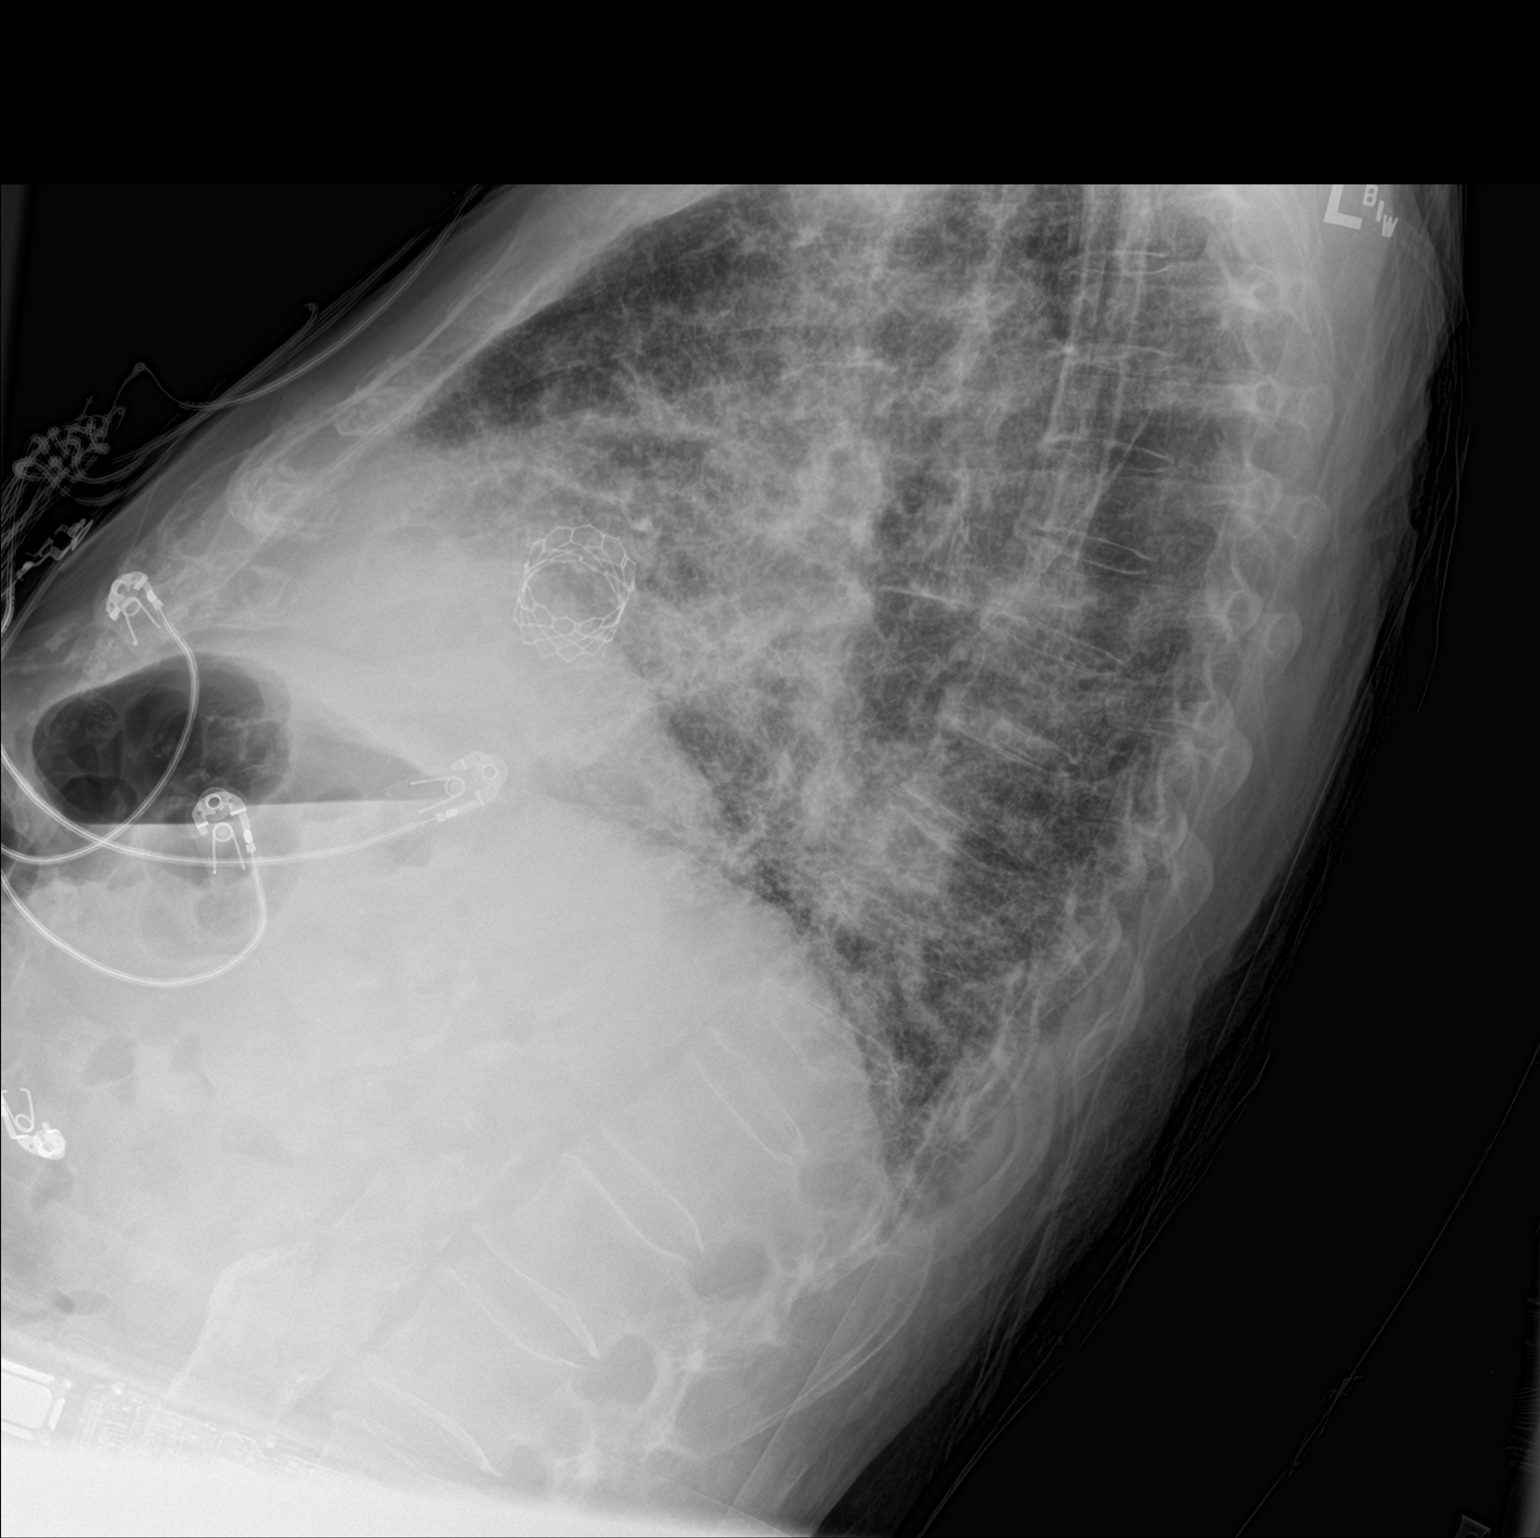

[chest ap]
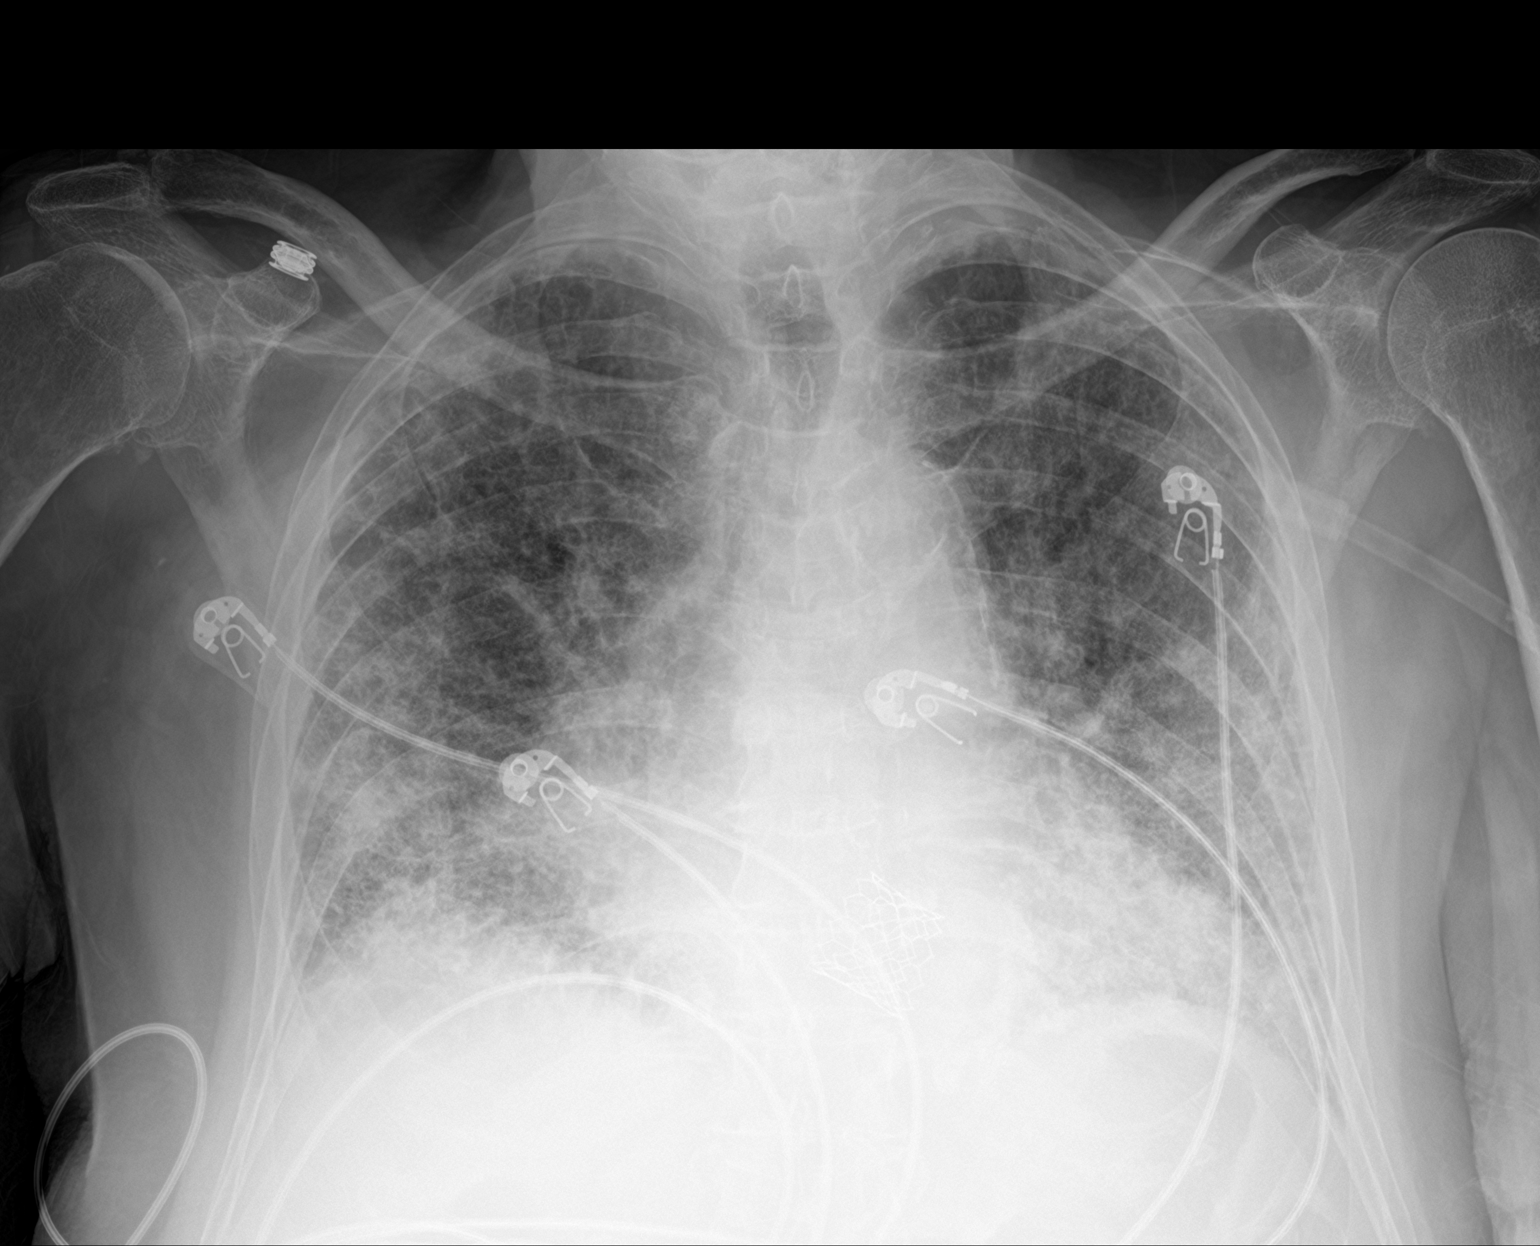

[2 of 2 positions shown; findings below may reference images not displayed]

FINDINGS: Chronic lung fibrosis is progressive over time. Mild cardiomegaly
with previous aortic valve replacement. Chronic aortic
atherosclerosis. No effusion. Old thoracolumbar compression
fracture.
IMPRESSION: Progressive pulmonary fibrotic changes. It would be possible for
some pulmonary inflammation to be hidden within the chronic changes.
No active process otherwise.
